# Patient Record
Sex: Female | Born: 1995 | Race: White | Hispanic: No | Marital: Single | Smoking: Never smoker
Health system: Southern US, Community
[De-identification: ages and names within clinical notes are randomized; demographics above are authoritative.]

## PROBLEM LIST (undated history)

## (undated) DIAGNOSIS — F32A Depression, unspecified: Secondary | ICD-10-CM

## (undated) DIAGNOSIS — F909 Attention-deficit hyperactivity disorder, unspecified type: Secondary | ICD-10-CM

## (undated) DIAGNOSIS — F329 Major depressive disorder, single episode, unspecified: Secondary | ICD-10-CM

## (undated) HISTORY — PX: URETER SURGERY: SHX823

---
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
## 2016-07-27 DIAGNOSIS — M25542 Pain in joints of left hand: Secondary | ICD-10-CM | POA: Diagnosis not present

## 2016-09-16 ENCOUNTER — Encounter: Payer: Self-pay | Admitting: Emergency Medicine

## 2016-09-16 ENCOUNTER — Emergency Department (HOSPITAL_COMMUNITY)
Admission: EM | Admit: 2016-09-16 | Discharge: 2016-09-16 | Disposition: A | Discharge status: Home / Self Care | Payer: BLUE CROSS/BLUE SHIELD | Attending: Emergency Medicine | Admitting: Emergency Medicine

## 2016-09-16 DIAGNOSIS — R112 Nausea with vomiting, unspecified: Secondary | ICD-10-CM | POA: Diagnosis not present

## 2016-09-16 DIAGNOSIS — F909 Attention-deficit hyperactivity disorder, unspecified type: Secondary | ICD-10-CM | POA: Insufficient documentation

## 2016-09-16 HISTORY — DX: Depression, unspecified: F32.A

## 2016-09-16 HISTORY — DX: Attention-deficit hyperactivity disorder, unspecified type: F90.9

## 2016-09-16 HISTORY — DX: Major depressive disorder, single episode, unspecified: F32.9

## 2016-09-16 LAB — URINALYSIS, ROUTINE W REFLEX MICROSCOPIC
Bilirubin Urine: NEGATIVE
Glucose, UA: NEGATIVE mg/dL
Ketones, ur: NEGATIVE mg/dL
Leukocytes, UA: NEGATIVE
Nitrite: NEGATIVE
Protein, ur: 30 mg/dL — AB
Specific Gravity, Urine: 1.017 (ref 1.005–1.030)
pH: 8 (ref 5.0–8.0)

## 2016-09-16 LAB — COMPREHENSIVE METABOLIC PANEL
ALT: 25 U/L (ref 14–54)
AST: 28 U/L (ref 15–41)
Albumin: 4.6 g/dL (ref 3.5–5.0)
Alkaline Phosphatase: 54 U/L (ref 38–126)
Anion gap: 9 (ref 5–15)
BUN: 13 mg/dL (ref 6–20)
CO2: 25 mmol/L (ref 22–32)
Calcium: 9.5 mg/dL (ref 8.9–10.3)
Chloride: 107 mmol/L (ref 101–111)
Creatinine, Ser: 0.73 mg/dL (ref 0.44–1.00)
GFR calc Af Amer: >60 mL/min (ref >60)
GFR calc non Af Amer: >60 mL/min (ref >60)
Glucose, Bld: 135 mg/dL — ABNORMAL HIGH (ref 65–99)
Potassium: 3.7 mmol/L (ref 3.5–5.1)
Sodium: 141 mmol/L (ref 135–145)
Total Bilirubin: 0.6 mg/dL (ref 0.3–1.2)
Total Protein: 8 g/dL (ref 6.5–8.1)

## 2016-09-16 LAB — CBC
HCT: 40.1 % (ref 36.0–46.0)
Hemoglobin: 14.3 g/dL (ref 12.0–15.0)
MCH: 31.6 pg (ref 26.0–34.0)
MCHC: 35.7 g/dL (ref 30.0–36.0)
MCV: 88.7 fL (ref 78.0–100.0)
Platelets: 318 10*3/uL (ref 150–400)
RBC: 4.52 MIL/uL (ref 3.87–5.11)
RDW: 12.5 % (ref 11.5–15.5)
WBC: 9 10*3/uL (ref 4.0–10.5)

## 2016-09-16 LAB — POC URINE PREG, ED: Preg Test, Ur: NEGATIVE

## 2016-09-16 LAB — LIPASE, BLOOD: Lipase: 22 U/L (ref 11–51)

## 2016-09-16 MED ORDER — ACETAMINOPHEN 325 MG PO TABS
650.0000 mg | ORAL_TABLET | Freq: Once | ORAL | Status: AC
  Administered 2016-09-16: 650 mg via ORAL
  Filled 2016-09-16: qty 2

## 2016-09-16 MED ORDER — ONDANSETRON HCL 4 MG PO TABS: 4.0000 mg | ORAL_TABLET | Freq: Three times a day (TID) | ORAL | 0 refills | Status: DC | PRN

## 2016-09-16 MED ORDER — DICYCLOMINE HCL 20 MG PO TABS: 20.0000 mg | ORAL_TABLET | Freq: Two times a day (BID) | ORAL | 0 refills | Status: DC

## 2016-09-16 MED ORDER — ONDANSETRON 8 MG PO TBDP
8.0000 mg | ORAL_TABLET | Freq: Once | ORAL | Status: AC
  Administered 2016-09-16: 8 mg via ORAL
  Filled 2016-09-16: qty 1

## 2016-09-16 MED ORDER — DICYCLOMINE HCL 10 MG PO CAPS
20.0000 mg | ORAL_CAPSULE | Freq: Once | ORAL | Status: AC
  Administered 2016-09-16: 20 mg via ORAL
  Filled 2016-09-16: qty 2

## 2016-09-16 NOTE — ED Provider Notes (Signed)
him like a light blue light.  [facility]-EMERGENCY DEPT Provider Note   CSN: [id number] Arrival date No & time: 09/16/16  [id number]     History   Chief Complaint Chief Complaint  Patient presents with  . Abdominal Pain  . Emesis  . Nausea    HPI Cassandra Rangel is a 21 y.o. female patient with onset abd pain, and about 15 episodes of vomiting NBNB vomitous. She has generalized abdominal pain and persistent nausea. She binge drank last night but states that it was not more than usual, however her vomiting was unusual. She denies contacts with similar sxs, ingestion of suspect foods or water, history of similar sxs, recent foreign travel . Denies hx of abdominal surgery, diarrhea or constipation.   HPI  Past Medical History:  Diagnosis Date  . ADHD   . Depression     There are no active problems to display for this patient.   History reviewed. No pertinent surgical history.  OB History    No data available       Home Medications    Prior to Admission medications   Not on File    Family History History reviewed. No pertinent family history.  Social History Social History  Substance Use Topics  . Smoking status: Never Smoker  . Smokeless tobacco: Never Used  . Alcohol use Yes     Allergies   Patient has no known allergies.   Review of Systems Review of Systems Ten systems reviewed and are negative for acute change, except as noted in the HPI.    Physical Exam Updated Vital Signs BP 132/86 (BP Location: Right Arm)   Pulse 76   Temp (!) 97.5 F (36.4 C) (Oral)   Resp 16   LMP 09/14/2016   SpO2 99%   Physical Exam  Constitutional: She is oriented to person, place, and time. She appears well-developed and well-nourished. No distress.  peekid Appears ill  HENT:  Head: Normocephalic and atraumatic.  Eyes: Pupils are equal, round, and reactive to light. Conjunctivae and EOM are normal. No scleral icterus.  Neck: Normal range of motion.    Cardiovascular: Normal rate, regular rhythm and normal heart sounds.  Exam reveals no gallop and no friction rub.   No murmur heard. Pulmonary/Chest: Effort normal and breath sounds normal. No respiratory distress.  Abdominal: Soft. Bowel sounds are normal. She exhibits no distension and no mass. There is no tenderness. There is no guarding.  Neurological: She is alert and oriented to person, place, and time.  Skin: Skin is warm. She is diaphoretic.  Psychiatric: Her behavior is normal.  Nursing note and vitals reviewed.    ED Treatments / Results  Labs (all labs ordered are listed, but only abnormal results are displayed) Labs Reviewed  COMPREHENSIVE METABOLIC PANEL - Abnormal; Notable for the following:       Result Value   Glucose, Bld 135 (*)    All other components within normal limits  URINALYSIS, ROUTINE W REFLEX MICROSCOPIC - Abnormal; Notable for the following:    APPearance HAZY (*)    Hgb urine dipstick SMALL (*)    Protein, ur 30 (*)    Bacteria, UA MANY (*)    Squamous Epithelial / LPF 0-5 (*)    All other components within normal limits  LIPASE, BLOOD  CBC  POC URINE PREG, ED    EKG  EKG Interpretation None       Radiology No results found.  Procedures Procedures (including critical care  time)  Medications Ordered in ED Medications  ondansetron (ZOFRAN-ODT) disintegrating tablet 8 mg (8 mg Oral Given 09/16/16 1109)     Initial Impression / Assessment and Plan / ED Course  I have reviewed the triage vital signs and the nursing notes.  Pertinent labs & imaging results that were available during my care of the patient were reviewed by me and considered in my medical decision making (see chart for details).     Patient with symptoms consistent with viral gastroenteritis.  Vitals are stable, no fever.  No signs of dehydration, tolerating PO fluids > 6 oz.  Lungs are clear.  No focal abdominal pain, no concern for appendicitis, cholecystitis,  pancreatitis, ruptured viscus, UTI, kidney stone, or any other abdominal etiology.  Supportive therapy indicated with return if symptoms worsen.  Patient counseled.  Final Clinical Impressions(s) / ED Diagnoses   Final diagnoses:  None    New Prescriptions New Prescriptions   No medications on file     Arthor CaptainHarris, Lonia Roane, PA-C 09/16/16 1640    Raeford RazorKohut, Stephen, MD 09/17/16 [phone]

## 2016-09-16 NOTE — ED Notes (Signed)
Provider at bedside

## 2016-09-16 NOTE — Discharge Instructions (Signed)

## 2016-09-16 NOTE — ED Triage Notes (Signed)
Pt reports 6/10 abd pain and emesis that started this AM.

## 2016-09-16 NOTE — ED Notes (Signed)
Patient able to keep fluids down.  She is c/o some throat pain but she thinks this might be due to vomiting earlier.

## 2016-12-05 DIAGNOSIS — Z6825 Body mass index (BMI) 25.0-25.9, adult: Secondary | ICD-10-CM | POA: Diagnosis not present

## 2016-12-05 DIAGNOSIS — N898 Other specified noninflammatory disorders of vagina: Secondary | ICD-10-CM | POA: Diagnosis not present

## 2016-12-05 DIAGNOSIS — Z113 Encounter for screening for infections with a predominantly sexual mode of transmission: Secondary | ICD-10-CM | POA: Diagnosis not present

## 2016-12-05 DIAGNOSIS — Z01419 Encounter for gynecological examination (general) (routine) without abnormal findings: Secondary | ICD-10-CM | POA: Diagnosis not present

## 2017-04-08 ENCOUNTER — Emergency Department (HOSPITAL_COMMUNITY)
Admission: EM | Admit: 2017-04-08 | Discharge: 2017-04-08 | Disposition: A | Discharge status: Home / Self Care | Payer: BLUE CROSS/BLUE SHIELD | Attending: Emergency Medicine | Admitting: Emergency Medicine

## 2017-04-08 ENCOUNTER — Other Ambulatory Visit: Payer: Self-pay

## 2017-04-08 ENCOUNTER — Encounter (HOSPITAL_COMMUNITY): Payer: Self-pay

## 2017-04-08 DIAGNOSIS — Z5321 Procedure and treatment not carried out due to patient leaving prior to being seen by health care provider: Secondary | ICD-10-CM | POA: Insufficient documentation

## 2017-04-08 DIAGNOSIS — H16041 Marginal corneal ulcer, right eye: Secondary | ICD-10-CM | POA: Diagnosis not present

## 2017-04-08 DIAGNOSIS — H5711 Ocular pain, right eye: Secondary | ICD-10-CM | POA: Diagnosis not present

## 2017-04-08 NOTE — ED Notes (Signed)
2x call to tx area, no answer

## 2017-04-08 NOTE — ED Notes (Signed)
Bed: WTR7 Expected date:  Expected time:  Means of arrival:  Comments: 

## 2017-04-08 NOTE — ED Triage Notes (Signed)
She c/o pain in right eye, which persisted even after she removed the contact lens from that eye.

## 2017-04-08 NOTE — ED Notes (Signed)
Pt did not answer when called

## 2017-04-08 NOTE — ED Notes (Signed)
1x call to fast track, no answer

## 2017-04-09 DIAGNOSIS — H16041 Marginal corneal ulcer, right eye: Secondary | ICD-10-CM | POA: Diagnosis not present

## 2017-05-25 DIAGNOSIS — F3289 Other specified depressive episodes: Secondary | ICD-10-CM | POA: Diagnosis not present

## 2017-05-25 DIAGNOSIS — M79671 Pain in right foot: Secondary | ICD-10-CM | POA: Diagnosis not present

## 2017-06-28 DIAGNOSIS — M79671 Pain in right foot: Secondary | ICD-10-CM | POA: Diagnosis not present

## 2017-07-12 DIAGNOSIS — M79671 Pain in right foot: Secondary | ICD-10-CM | POA: Diagnosis not present

## 2017-07-13 DIAGNOSIS — M79671 Pain in right foot: Secondary | ICD-10-CM | POA: Diagnosis not present

## 2017-07-18 DIAGNOSIS — M79671 Pain in right foot: Secondary | ICD-10-CM | POA: Diagnosis not present

## 2017-07-19 DIAGNOSIS — L700 Acne vulgaris: Secondary | ICD-10-CM | POA: Diagnosis not present

## 2017-08-09 DIAGNOSIS — M79671 Pain in right foot: Secondary | ICD-10-CM | POA: Diagnosis not present

## 2017-08-22 DIAGNOSIS — Z79899 Other long term (current) drug therapy: Secondary | ICD-10-CM | POA: Diagnosis not present

## 2017-08-22 DIAGNOSIS — L700 Acne vulgaris: Secondary | ICD-10-CM | POA: Diagnosis not present

## 2017-08-31 DIAGNOSIS — M79671 Pain in right foot: Secondary | ICD-10-CM | POA: Diagnosis not present

## 2017-10-15 DIAGNOSIS — M79671 Pain in right foot: Secondary | ICD-10-CM | POA: Diagnosis not present

## 2017-10-18 DIAGNOSIS — L700 Acne vulgaris: Secondary | ICD-10-CM | POA: Diagnosis not present

## 2017-10-23 DIAGNOSIS — M79671 Pain in right foot: Secondary | ICD-10-CM | POA: Diagnosis not present

## 2017-10-31 DIAGNOSIS — M79671 Pain in right foot: Secondary | ICD-10-CM | POA: Diagnosis not present

## 2018-02-04 ENCOUNTER — Other Ambulatory Visit: Payer: Self-pay

## 2018-02-04 ENCOUNTER — Emergency Department (HOSPITAL_COMMUNITY): Payer: BLUE CROSS/BLUE SHIELD

## 2018-02-04 ENCOUNTER — Encounter (HOSPITAL_COMMUNITY): Payer: Self-pay | Admitting: Emergency Medicine

## 2018-02-04 ENCOUNTER — Observation Stay (HOSPITAL_COMMUNITY)
Admission: EM | Admit: 2018-02-04 | Discharge: 2018-02-06 | Disposition: A | Discharge status: Home / Self Care | Payer: BLUE CROSS/BLUE SHIELD | Attending: Internal Medicine | Admitting: Internal Medicine

## 2018-02-04 DIAGNOSIS — F329 Major depressive disorder, single episode, unspecified: Secondary | ICD-10-CM | POA: Diagnosis not present

## 2018-02-04 DIAGNOSIS — F909 Attention-deficit hyperactivity disorder, unspecified type: Secondary | ICD-10-CM | POA: Diagnosis not present

## 2018-02-04 DIAGNOSIS — R112 Nausea with vomiting, unspecified: Secondary | ICD-10-CM | POA: Diagnosis not present

## 2018-02-04 DIAGNOSIS — E538 Deficiency of other specified B group vitamins: Secondary | ICD-10-CM | POA: Diagnosis present

## 2018-02-04 DIAGNOSIS — R000 Tachycardia, unspecified: Secondary | ICD-10-CM | POA: Diagnosis not present

## 2018-02-04 DIAGNOSIS — E876 Hypokalemia: Secondary | ICD-10-CM | POA: Diagnosis not present

## 2018-02-04 DIAGNOSIS — N10 Acute tubulo-interstitial nephritis: Secondary | ICD-10-CM | POA: Diagnosis not present

## 2018-02-04 DIAGNOSIS — R1110 Vomiting, unspecified: Secondary | ICD-10-CM | POA: Diagnosis not present

## 2018-02-04 DIAGNOSIS — E860 Dehydration: Secondary | ICD-10-CM | POA: Diagnosis present

## 2018-02-04 DIAGNOSIS — E349 Endocrine disorder, unspecified: Secondary | ICD-10-CM | POA: Diagnosis present

## 2018-02-04 DIAGNOSIS — K219 Gastro-esophageal reflux disease without esophagitis: Secondary | ICD-10-CM | POA: Insufficient documentation

## 2018-02-04 DIAGNOSIS — A419 Sepsis, unspecified organism: Secondary | ICD-10-CM | POA: Diagnosis present

## 2018-02-04 DIAGNOSIS — N179 Acute kidney failure, unspecified: Secondary | ICD-10-CM | POA: Diagnosis present

## 2018-02-04 DIAGNOSIS — N12 Tubulo-interstitial nephritis, not specified as acute or chronic: Secondary | ICD-10-CM | POA: Diagnosis not present

## 2018-02-04 DIAGNOSIS — R5383 Other fatigue: Secondary | ICD-10-CM | POA: Diagnosis not present

## 2018-02-04 DIAGNOSIS — R7989 Other specified abnormal findings of blood chemistry: Secondary | ICD-10-CM | POA: Diagnosis present

## 2018-02-04 DIAGNOSIS — I959 Hypotension, unspecified: Secondary | ICD-10-CM | POA: Diagnosis present

## 2018-02-04 LAB — COMPREHENSIVE METABOLIC PANEL
ALBUMIN: 4.1 g/dL (ref 3.5–5.0)
ALT: 14 U/L (ref 0–44)
ANION GAP: 13 (ref 5–15)
AST: 20 U/L (ref 15–41)
Alkaline Phosphatase: 65 U/L (ref 38–126)
BUN: 16 mg/dL (ref 6–20)
CO2: 24 mmol/L (ref 22–32)
Calcium: 9.3 mg/dL (ref 8.9–10.3)
Chloride: 99 mmol/L (ref 98–111)
Creatinine, Ser: 1.04 mg/dL — ABNORMAL HIGH (ref 0.44–1.00)
GFR calc Af Amer: >60 mL/min (ref >60)
GFR calc non Af Amer: >60 mL/min (ref >60)
GLUCOSE: 130 mg/dL — AB (ref 70–99)
Potassium: 3.4 mmol/L — ABNORMAL LOW (ref 3.5–5.1)
Sodium: 136 mmol/L (ref 135–145)
Total Bilirubin: 0.8 mg/dL (ref 0.3–1.2)
Total Protein: 8.3 g/dL — ABNORMAL HIGH (ref 6.5–8.1)

## 2018-02-04 LAB — CBC WITH DIFFERENTIAL/PLATELET
Abs Immature Granulocytes: 0.5 10*3/uL — ABNORMAL HIGH (ref 0.00–0.07)
Basophils Absolute: 0.1 10*3/uL (ref 0.0–0.1)
Basophils Relative: 0 %
Eosinophils Absolute: 0.1 10*3/uL (ref 0.0–0.5)
Eosinophils Relative: 0 %
HCT: 38.7 % (ref 36.0–46.0)
Hemoglobin: 13.1 g/dL (ref 12.0–15.0)
Immature Granulocytes: 2 %
Lymphocytes Relative: 5 %
Lymphs Abs: 1.3 10*3/uL (ref 0.7–4.0)
MCH: 32.3 pg (ref 26.0–34.0)
MCHC: 33.9 g/dL (ref 30.0–36.0)
MCV: 95.6 fL (ref 80.0–100.0)
Monocytes Absolute: 2.5 10*3/uL — ABNORMAL HIGH (ref 0.1–1.0)
Monocytes Relative: 9 %
Neutro Abs: 23.6 10*3/uL — ABNORMAL HIGH (ref 1.7–7.7)
Neutrophils Relative %: 84 %
Platelets: 206 10*3/uL (ref 150–400)
RBC: 4.05 MIL/uL (ref 3.87–5.11)
RDW: 12.4 % (ref 11.5–15.5)
WBC: 28 10*3/uL — ABNORMAL HIGH (ref 4.0–10.5)
nRBC: 0 % (ref 0.0–0.2)

## 2018-02-04 LAB — URINALYSIS, ROUTINE W REFLEX MICROSCOPIC
Bilirubin Urine: NEGATIVE
Glucose, UA: NEGATIVE mg/dL
Ketones, ur: 80 mg/dL — AB
Nitrite: NEGATIVE
Protein, ur: 100 mg/dL — AB
RBC / HPF: >50 RBC/hpf — ABNORMAL HIGH (ref 0–5)
Specific Gravity, Urine: 1.018 (ref 1.005–1.030)
pH: 6 (ref 5.0–8.0)

## 2018-02-04 LAB — I-STAT BETA HCG BLOOD, ED (MC, WL, AP ONLY): I-stat hCG, quantitative: 19.1 m[IU]/mL — ABNORMAL HIGH (ref <5)

## 2018-02-04 LAB — HCG, QUANTITATIVE, PREGNANCY: HCG, BETA CHAIN, QUANT, S: 1 m[IU]/mL (ref <5)

## 2018-02-04 LAB — I-STAT CG4 LACTIC ACID, ED: Lactic Acid, Venous: 1.12 mmol/L (ref 0.5–1.9)

## 2018-02-04 MED ORDER — ONDANSETRON HCL 4 MG/2ML IJ SOLN
4.0000 mg | Freq: Once | INTRAMUSCULAR | Status: AC
  Administered 2018-02-04: 4 mg via INTRAVENOUS
  Filled 2018-02-04: qty 2

## 2018-02-04 MED ORDER — SODIUM CHLORIDE 0.9 % IV SOLN
INTRAVENOUS | Status: DC
  Administered 2018-02-05 (×3): via INTRAVENOUS

## 2018-02-04 MED ORDER — KETOROLAC TROMETHAMINE 30 MG/ML IJ SOLN
30.0000 mg | Freq: Four times a day (QID) | INTRAMUSCULAR | Status: DC | PRN
  Administered 2018-02-05 (×2): 30 mg via INTRAVENOUS
  Filled 2018-02-04 (×2): qty 1

## 2018-02-04 MED ORDER — SODIUM CHLORIDE 0.9 % IV BOLUS
1000.0000 mL | Freq: Once | INTRAVENOUS | Status: AC
  Administered 2018-02-04: 1000 mL via INTRAVENOUS

## 2018-02-04 MED ORDER — IOPAMIDOL (ISOVUE-300) INJECTION 61%
INTRAVENOUS | Status: AC
  Filled 2018-02-04: qty 100

## 2018-02-04 MED ORDER — METHOCARBAMOL 500 MG PO TABS: 500.0000 mg | ORAL_TABLET | Freq: Two times a day (BID) | ORAL | 0 refills | Status: DC

## 2018-02-04 MED ORDER — METOCLOPRAMIDE HCL 5 MG/ML IJ SOLN
5.0000 mg | Freq: Once | INTRAMUSCULAR | Status: AC
  Administered 2018-02-04: 5 mg via INTRAVENOUS
  Filled 2018-02-04: qty 2

## 2018-02-04 MED ORDER — ENOXAPARIN SODIUM 40 MG/0.4ML ~~LOC~~ SOLN
40.0000 mg | Freq: Every day | SUBCUTANEOUS | Status: DC
  Administered 2018-02-05 (×2): 40 mg via SUBCUTANEOUS
  Filled 2018-02-04 (×2): qty 0.4

## 2018-02-04 MED ORDER — SULFAMETHOXAZOLE-TRIMETHOPRIM 800-160 MG PO TABS: 1.0000 | ORAL_TABLET | Freq: Two times a day (BID) | ORAL | 0 refills | Status: DC

## 2018-02-04 MED ORDER — HYDROMORPHONE HCL 1 MG/ML IJ SOLN: 0.5000 mg | Freq: Once | INTRAMUSCULAR | Status: DC

## 2018-02-04 MED ORDER — ONDANSETRON HCL 4 MG PO TABS: 4.0000 mg | ORAL_TABLET | Freq: Four times a day (QID) | ORAL | Status: DC | PRN

## 2018-02-04 MED ORDER — SODIUM CHLORIDE (PF) 0.9 % IJ SOLN
INTRAMUSCULAR | Status: AC
  Filled 2018-02-04: qty 50

## 2018-02-04 MED ORDER — DIPHENHYDRAMINE HCL 50 MG/ML IJ SOLN
12.5000 mg | Freq: Once | INTRAMUSCULAR | Status: AC
  Administered 2018-02-04: 12.5 mg via INTRAVENOUS
  Filled 2018-02-04: qty 1

## 2018-02-04 MED ORDER — SODIUM CHLORIDE 0.9 % IV SOLN
1.0000 g | INTRAVENOUS | Status: DC
  Administered 2018-02-05 – 2018-02-06 (×2): 1 g via INTRAVENOUS
  Filled 2018-02-04 (×2): qty 1

## 2018-02-04 MED ORDER — IOPAMIDOL (ISOVUE-300) INJECTION 61%
100.0000 mL | Freq: Once | INTRAVENOUS | Status: AC | PRN
  Administered 2018-02-04: 100 mL via INTRAVENOUS

## 2018-02-04 MED ORDER — ONDANSETRON 4 MG PO TBDP: 4.0000 mg | ORAL_TABLET | Freq: Three times a day (TID) | ORAL | 0 refills | Status: DC | PRN

## 2018-02-04 MED ORDER — ONDANSETRON HCL 4 MG/2ML IJ SOLN
4.0000 mg | Freq: Four times a day (QID) | INTRAMUSCULAR | Status: DC | PRN
  Administered 2018-02-05: 4 mg via INTRAVENOUS
  Filled 2018-02-04: qty 2

## 2018-02-04 MED ORDER — ACETAMINOPHEN 325 MG PO TABS
650.0000 mg | ORAL_TABLET | Freq: Once | ORAL | Status: AC
  Administered 2018-02-04: 650 mg via ORAL
  Filled 2018-02-04: qty 2

## 2018-02-04 MED ORDER — POTASSIUM CHLORIDE 10 MEQ/100ML IV SOLN
10.0000 meq | INTRAVENOUS | Status: AC
  Filled 2018-02-04: qty 100

## 2018-02-04 MED ORDER — SODIUM CHLORIDE 0.9 % IV SOLN
1.0000 g | Freq: Once | INTRAVENOUS | Status: AC
  Administered 2018-02-04: 1 g via INTRAVENOUS
  Filled 2018-02-04: qty 10

## 2018-02-04 NOTE — ED Provider Notes (Addendum)
[facility]-EMERGENCY DEPT Provider Note   CSN: [id number] Arrival date & time: 02/04/18  1422     History   Chief Complaint Chief Complaint  Patient presents with  . elevated WBC  . Flank Pain    HPI Cassandra Rangel is a 22 y.o. female who presents to ED for 3-day history of right-sided flank pain radiating to right middle quadrant and right lower quadrant.  States that she had a cheerleading competition 3 days ago which exacerbated her pain.  She also complains of a headache, several episodes of nonbloody, nonbilious emesis since 3 days ago as well.  She has had improvement in her headache with Excedrin.  She took Excedrin approximately 5 hours prior to arrival.  She denies any direct sick contacts with similar symptoms but states that she may have been exposed to something while being around children at the cheerleading competition.  She denies any dysuria, hematuria, history of kidney stones, vaginal complaints, fever. 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  HPI  Past Medical History:  Diagnosis Date  . ADHD   . Depression     There are no active problems to display for this patient.   Past Surgical History:  Procedure Laterality Date  . URETER SURGERY       OB History   No obstetric history on file.      Home Medications    Prior to Admission medications   Medication Sig Start Date End Date Taking? Authorizing Provider  Adapalene 0.3 % gel Apply 1 application topically daily.   Yes [provider]  aspirin-acetaminophen-caffeine (EXCEDRIN MIGRAINE) [phone] MG tablet Take 2 tablets by mouth daily as needed for headache or migraine.   Yes [provider]  dicyclomine (BENTYL) 20 MG tablet Take 1 tablet (20 mg total) by mouth 2 (two) times daily. Patient not taking: Reported on 02/04/2018 09/16/16   Arthor Captain, PA-C  methocarbamol (ROBAXIN) 500 MG tablet Take  1 tablet (500 mg total) by mouth 2 (two) times daily. 02/04/18   Cassandra Kohen, PA-C  ondansetron (ZOFRAN ODT) 4 MG disintegrating tablet Take 1 tablet (4 mg total) by mouth every 8 (eight) hours as needed for nausea or vomiting. 02/04/18   Cassandra Wachsmuth, PA-C  ondansetron (ZOFRAN) 4 MG tablet Take 1 tablet (4 mg total) by mouth every 8 (eight) hours as needed for nausea or vomiting. Patient not taking: Reported on 02/04/2018 09/16/16   Arthor Captain, PA-C  sulfamethoxazole-trimethoprim (BACTRIM DS,SEPTRA DS) 800-160 MG tablet Take 1 tablet by mouth 2 (two) times daily for 10 days. 02/04/18 02/14/18  Dietrich Pates, PA-C    Family History No family history on file.  Social History Social History   Tobacco Use  . Smoking status: Never Smoker  . Smokeless tobacco: Never Used  Substance Use Topics  . Alcohol use: Yes  . Drug use: Not on file     Allergies   Patient has no known allergies.   Review of Systems Review of Systems  Constitutional: Positive for diaphoresis and fatigue. Negative for appetite change, chills and fever.  HENT: Negative for ear pain, rhinorrhea, sneezing and sore throat.   Eyes: Negative for photophobia and visual disturbance.  Respiratory: Negative for cough, chest tightness, shortness of breath and wheezing.   Cardiovascular: Negative for chest pain and palpitations.  Gastrointestinal: Positive for nausea and vomiting. Negative for abdominal pain, blood in stool, constipation and diarrhea.  Genitourinary:  Positive for flank pain. Negative for dysuria, hematuria and urgency.  Musculoskeletal: Negative for myalgias.  Skin: Negative for rash.  Neurological: Negative for dizziness, weakness and light-headedness.     Physical Exam Updated Vital Signs BP (!) 97/52   Pulse 75   Temp 99.1 F (37.3 C) (Oral)   Resp 16   Ht 5' 5.5" (1.664 m)   Wt 66.3 kg   LMP 01/06/2018   SpO2 100%   BMI 23.94 kg/m   Physical Exam Vitals signs and nursing note  reviewed.  Constitutional:      General: She is not in acute distress.    Appearance: She is well-developed.     Comments: Diaphoretic. NAD.  HENT:     Head: Normocephalic and atraumatic.     Nose: Nose normal.  Eyes:     General: No scleral icterus.       Right eye: No discharge.        Left eye: No discharge.     Conjunctiva/sclera: Conjunctivae normal.  Neck:     Musculoskeletal: Normal range of motion and neck supple.     Comments: No meningismus. Cardiovascular:     Rate and Rhythm: Normal rate and regular rhythm.     Heart sounds: Normal heart sounds. No murmur. No friction rub. No gallop.   Pulmonary:     Effort: Pulmonary effort is normal. No respiratory distress.     Breath sounds: Normal breath sounds.  Abdominal:     General: Bowel sounds are normal. There is no distension.     Palpations: Abdomen is soft.     Tenderness: There is abdominal tenderness (RMQ, RLQ, R flank). There is no guarding.  Musculoskeletal: Normal range of motion.  Skin:    General: Skin is warm and dry.     Findings: No rash.  Neurological:     Mental Status: She is alert and oriented to person, place, and time.     Cranial Nerves: No cranial nerve deficit.     Sensory: No sensory deficit.     Motor: No abnormal muscle tone.     Coordination: Coordination normal.      ED Treatments / Results  Labs (all labs ordered are listed, but only abnormal results are displayed) Labs Reviewed  COMPREHENSIVE METABOLIC PANEL - Abnormal; Notable for the following components:      Result Value   Potassium 3.4 (*)    Glucose, Bld 130 (*)    Creatinine, Ser 1.04 (*)    Total Protein 8.3 (*)    All other components within normal limits  CBC WITH DIFFERENTIAL/PLATELET - Abnormal; Notable for the following components:   WBC 28.0 (*)    Neutro Abs 23.6 (*)    Monocytes Absolute 2.5 (*)    Abs Immature Granulocytes 0.50 (*)    All other components within normal limits  URINALYSIS, ROUTINE W REFLEX  MICROSCOPIC - Abnormal; Notable for the following components:   APPearance HAZY (*)    Hgb urine dipstick LARGE (*)    Ketones, ur 80 (*)    Protein, ur 100 (*)    Leukocytes, UA TRACE (*)    RBC / HPF >50 (*)    Bacteria, UA RARE (*)    All other components within normal limits  I-STAT BETA HCG BLOOD, ED ([facility], [facility], [facility] ONLY) - Abnormal; Notable for the following components:   I-stat hCG, quantitative 19.1 (*)    All other components within normal limits  URINE CULTURE  HCG, QUANTITATIVE,  PREGNANCY  I-STAT CG4 LACTIC ACID, ED    EKG None  Radiology Ct Abdomen Pelvis W Contrast  Result Date: 02/04/2018 CLINICAL DATA:  Leukocytosis and flank pain with nausea and vomiting since Saturday. EXAM: CT ABDOMEN AND PELVIS WITH CONTRAST TECHNIQUE: Multidetector CT imaging of the abdomen and pelvis was performed using the standard protocol following bolus administration of intravenous contrast. CONTRAST:  100mL ISOVUE-300 IOPAMIDOL (ISOVUE-300) INJECTION 61% COMPARISON:  12/01/2008 FINDINGS: Lower chest: No acute abnormality. Hepatobiliary: No focal liver abnormality is seen. No gallstones, gallbladder wall thickening, or biliary dilatation. Pancreas: Unremarkable. No pancreatic ductal dilatation or surrounding inflammatory changes. Spleen: Normal in size without focal abnormality. Adrenals/Urinary Tract: Normal bilateral adrenal glands. Abnormal enhancement pattern of the right kidney with striated appearance consistent with acute pyelonephritis. More confluent areas of hypodensity are identified in the upper pole of the right kidney as well as it's medial interpolar aspect that may reflect areas of lobar nephronia. No obstructive uropathy. The urinary bladder is unremarkable without focal mural thickening. Stomach/Bowel: Stomach is within normal limits. Appendix appears normal. No evidence of bowel wall thickening, distention, or inflammatory changes. Vascular/Lymphatic: No significant vascular findings  are present. No enlarged abdominal or pelvic lymph nodes. Reproductive: Ut the uterus is unremarkable. Small peripheral follicles are noted within both ovaries. These may reflect stigmata of polycystic ovarian syndrome Other: No free air free fluid. Musculoskeletal: No acute or significant osseous findings. IMPRESSION: 1. Abnormal enhancement pattern of the right kidney with striated appearance of the right kidney consistent with acute pyelonephritis. More confluent areas of hypodensity are identified in the upper pole and medial interpolar aspect of the right kidney that may reflect areas of lobar nephronia. No obstructive uropathy. 2. Small peripheral follicles are noted within both ovaries. These may reflect stigmata of polycystic ovarian syndrome. Electronically Signed   By: Tollie Ethavid  Kwon M.D.   On: 02/04/2018 18:28    Procedures Procedures (including critical care time)  Medications Ordered in ED Medications  iopamidol (ISOVUE-300) 61 % injection (has no administration in time range)  sodium chloride (PF) 0.9 % injection (has no administration in time range)  sodium chloride 0.9 % bolus 1,000 mL (1,000 mLs Intravenous New Bag/Given 02/04/18 1925)  sodium chloride 0.9 % bolus 1,000 mL (0 mLs Intravenous Stopped 02/04/18 1745)  metoCLOPramide (REGLAN) injection 5 mg (5 mg Intravenous Given 02/04/18 1742)  diphenhydrAMINE (BENADRYL) injection 12.5 mg (12.5 mg Intravenous Given 02/04/18 1742)  iopamidol (ISOVUE-300) 61 % injection 100 mL (100 mLs Intravenous Contrast Given 02/04/18 1806)  cefTRIAXone (ROCEPHIN) 1 g in sodium chloride 0.9 % 100 mL IVPB (1 g Intravenous New Bag/Given 02/04/18 1926)     Initial Impression / Assessment and Plan / ED Course  I have reviewed the triage vital signs and the nursing notes.  Pertinent labs & imaging results that were available during my care of the patient were reviewed by me and considered in my medical decision making (see chart for  details).  Clinical Course as of Feb 04 2021  Mon Feb 04, 2018  1859 Spoke to Dr. Berneice HeinrichManny of urology.  He recommends trial of outpatient p.o. antibiotics for infection.  He was able to view images.   [HK]    Clinical Course User Index [HK] Dietrich PatesKhatri, Harbert Fitterer, PA-C    22 year old female presents to ED for 3-day history of right-sided flank pain radiating to right middle and lower quadrant of the abdomen.  She also reports headache, nonbloody, nonbilious emesis and generalized fatigue.  She had a cheerleading  competition 3 days ago which she feels exacerbated her pain.  She has had improvement in her headache with Excedrin.  Denies any dysuria, hematuria, fever, vaginal complaints or changes to bowel movements.  On exam patient is in no acute distress.  She is diaphoretic.  Slightly tachycardic.  She is afebrile.  She has right CVA tenderness, right lower quadrant tenderness to palpation.  Lab work significant for leukocytosis of 18, i-STAT hCG slightly elevated at 19.  Recheck of hCG quant is negative.  CMP unremarkable.  Urine shows hematuria, trace leukocytes and rare bacteria.  CT of the abdomen pelvis shows findings consistent with pyelonephritis and possibly lobar nephronia. I spoke to Dr. Berneice Heinrich about this. He recommends PO Bactrim if patient is able to tolerate p.o. intake here.  She was given 2 L of fluids, Reglan, 1 g of Rocephin with significant improvement in her symptoms.  She is able to tolerate p.o. intake without difficulty.  She is comfortable with discharge home with antibiotics, antiemetics and muscle relaxer for her back pain. She was advised to return to ED for any severe or worsening symptoms.  9:15 PM At discharge, patient continues to have nausea, vomiting and chills.  Due to patient's inability to tolerate p.o. intake, I feel that she will benefit from admission and observation. Will consult hospitalist for admission.   Portions of this note were generated with [employer]. Dictation errors may occur despite best attempts at proofreading.   Final Clinical Impressions(s) / ED Diagnoses   Final diagnoses:  Pyelonephritis    ED Discharge Orders         Ordered    sulfamethoxazole-trimethoprim (BACTRIM DS,SEPTRA DS) 800-160 MG tablet  2 times daily     02/04/18 2017    ondansetron (ZOFRAN ODT) 4 MG disintegrating tablet  Every 8 hours PRN     02/04/18 2017    methocarbamol (ROBAXIN) 500 MG tablet  2 times daily     02/04/18 2017            Dietrich Pates, PA-C 02/04/18 2116    Benjiman Core, MD 02/05/18 0006

## 2018-02-04 NOTE — ED Triage Notes (Signed)
[facility] and flank pain and n/v since Saturday. Pt also has headache.

## 2018-02-04 NOTE — ED Notes (Signed)
Pt had vagal episode during blood draw. Unable to collect I-stat at this time.

## 2018-02-04 NOTE — H&P (Addendum)
History and Physical    Cassandra Rangel:[id number] DOB: 02/19/1996 DOA: 02/04/2018  Referring MD/NP/PA: Dietrich Pates, PA-C PCP: Patient, No Pcp Per  Patient coming from: home  Chief Complaint: Flank pain  I have personally briefly reviewed patient's old medical records in [facility]   HPI: Cassandra Rangel is a 22 y.o. female with medical history significant of ureter surgery; who presents with complaints of right flank pain.  Symptoms have been most notable over the last 2 to 3 days.  Reports having a feeling of a knot in her right flank and right-sided abdominal pain.  Pain was worse with deep inspiration.  Note associated symptoms of subjective fevers, chills, diaphoresis, muscle cramps, nausea, vomiting, and malaise.  Denies any prolonged travel, leg swelling, calf pain, chest pain, dysuria, urinary frequency, or loss of consciousness.  She reports never having symptoms like this previously in the past.  However, noted that the nausea and vomiting symptoms seem to occur around 8 PM every night since onset.  Emesis has been nonbloody.  Patient tried drinking Gatorade without significant improvement of symptoms.  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  Since that time she is had no problem denies any previous history of UTI.   ED Course: Admission into the emergency department patient was noted to have a temperature of 99.1 F, pulse 67-108, pressure is 90/51-118/76, all other vital signs maintained.  Labs revealed WBC and 28, potassium 3.4, BUN 16, creatinine 1.04, and lactic acid 1.12.  CT scan of the abdomen/pelvis reveal signs of acute right sided pyelonephritis with areas of lobar nephronia, but no signs of obstruction.  Patient was given 2 L normal saline IV fluids, Rocephin, Zofran, Reglan,  Tylenol, and Benadryl.  Urology Dr. Berneice Heinrich had been contacted regarding patient blood recommended oral Bactrim for 10 days when able.  Patient still was not able to tolerate p.o. intake. [facility] called to admit.   Review of Systems  Constitutional: Positive for chills, fever and malaise/fatigue.  HENT: Negative for congestion and ear pain.   Eyes: Negative for double vision and photophobia.  Respiratory: Negative for cough and shortness of breath.   Cardiovascular: Negative for chest pain, claudication and leg swelling.  Gastrointestinal: Positive for abdominal pain, nausea and vomiting. Negative for diarrhea.  Genitourinary: Positive for flank pain. Negative for dysuria and frequency.  Musculoskeletal: Positive for joint pain and myalgias. Negative for falls.  Skin: Negative for itching and rash.  Neurological: Negative for focal weakness and loss of consciousness.  Psychiatric/Behavioral: Negative for memory loss and substance abuse.    Past Medical History:  Diagnosis Date  . ADHD   . Depression     Past Surgical History:  Procedure Laterality Date  . URETER SURGERY       reports that she has never smoked. She has never used smokeless tobacco. She reports current alcohol use. No history on file for drug.  No Known Allergies  No family history on file.  Prior to Admission medications   Medication Sig Start Date End Date Taking? Authorizing Provider  Adapalene 0.3 % gel Apply 1 application topically daily.   Yes [provider]  aspirin-acetaminophen-caffeine (EXCEDRIN MIGRAINE) [phone] MG tablet Take 2 tablets by mouth daily as needed for headache or migraine.   Yes [provider]  dicyclomine (  BENTYL) 20 MG tablet Take 1 tablet (20 mg total) by mouth 2 (two) times daily. Patient not taking: Reported on 02/04/2018 09/16/16   Arthor CaptainHarris, Abigail, PA-C  methocarbamol (ROBAXIN) 500 MG tablet Take 1 tablet (500 mg total) by mouth 2 (two) times daily. 02/04/18    Khatri, Hina, PA-C  ondansetron (ZOFRAN ODT) 4 MG disintegrating tablet Take 1 tablet (4 mg total) by mouth every 8 (eight) hours as needed for nausea or vomiting. 02/04/18   Khatri, Hina, PA-C  ondansetron (ZOFRAN) 4 MG tablet Take 1 tablet (4 mg total) by mouth every 8 (eight) hours as needed for nausea or vomiting. Patient not taking: Reported on 02/04/2018 09/16/16   Arthor CaptainHarris, Abigail, PA-C  sulfamethoxazole-trimethoprim (BACTRIM DS,SEPTRA DS) 800-160 MG tablet Take 1 tablet by mouth 2 (two) times daily for 10 days. 02/04/18 02/14/18  Dietrich PatesKhatri, Hina, PA-C    Physical Exam:  Constitutional: NAD, calm, comfortable Vitals:   02/04/18 1800 02/04/18 1830 02/04/18 2000 02/04/18 2130  BP: 102/67 (!) 90/51 (!) 97/52 116/78  Pulse: 67 81 75 (!) 104  Resp: 16 14 16 17   Temp:      TempSrc:      SpO2: 98% 99% 100% 100%  Weight:      Height:       Eyes: PERRL, lids and conjunctivae normal ENMT: Mucous membranes are moist. Posterior pharynx clear of any exudate or lesions.Normal dentition.  Neck: normal, supple, no masses, no thyromegaly Respiratory: clear to auscultation bilaterally, no wheezing, no crackles. Normal respiratory effort. No accessory muscle use.  Cardiovascular: Regular rate and rhythm, no murmurs / rubs / gallops. No extremity edema. 2+ pedal pulses. No carotid bruits.  Abdomen: Mild right quadrant tenderness with moderate right CVA tenderness noted.  Bowel sounds within normal limits. Musculoskeletal: no clubbing / cyanosis. No joint deformity upper and lower extremities. Good ROM, no contractures. Normal muscle tone.  Skin: no rashes, lesions, ulcers. No induration Neurologic: CN 2-12 grossly intact. Sensation intact, DTR normal. Strength 5/5 in all 4.  Psychiatric: Normal judgment and insight. Alert and oriented x 3. Normal mood.     Labs on Admission: I have personally reviewed following labs and imaging studies  CBC: Recent Labs  Lab 02/04/18 1432  WBC 28.0*  NEUTROABS  23.6*  HGB 13.1  HCT 38.7  MCV 95.6  PLT 206   Basic Metabolic Panel: Recent Labs  Lab 02/04/18 1432  NA 136  K 3.4*  CL 99  CO2 24  GLUCOSE 130*  BUN 16  CREATININE 1.04*  CALCIUM 9.3   GFR: Estimated Creatinine Clearance: 78 mL/min (A) (by C-G formula based on SCr of 1.04 mg/dL (H)). Liver Function Tests: Recent Labs  Lab 02/04/18 1432  AST 20  ALT 14  ALKPHOS 65  BILITOT 0.8  PROT 8.3*  ALBUMIN 4.1   No results for input(s): LIPASE, AMYLASE in the last 168 hours. No results for input(s): AMMONIA in the last 168 hours. Coagulation Profile: No results for input(s): INR, PROTIME in the last 168 hours. Cardiac Enzymes: No results for input(s): CKTOTAL, CKMB, CKMBINDEX, TROPONINI in the last 168 hours. BNP (last 3 results) No results for input(s): PROBNP in the last 8760 hours. HbA1C: No results for input(s): HGBA1C in the last 72 hours. CBG: No results for input(s): GLUCAP in the last 168 hours. Lipid Profile: No results for input(s): CHOL, HDL, LDLCALC, TRIG, CHOLHDL, LDLDIRECT in the last 72 hours. Thyroid Function Tests: No results for input(s): TSH, T4TOTAL, FREET4, T3FREE, THYROIDAB in the  last 72 hours. Anemia Panel: No results for input(s): VITAMINB12, FOLATE, FERRITIN, TIBC, IRON, RETICCTPCT in the last 72 hours. Urine analysis:    Component Value Date/Time   COLORURINE YELLOW 02/04/2018 1641   APPEARANCEUR HAZY (A) 02/04/2018 1641   LABSPEC 1.018 02/04/2018 1641   PHURINE 6.0 02/04/2018 1641   GLUCOSEU NEGATIVE 02/04/2018 1641   HGBUR LARGE (A) 02/04/2018 1641   BILIRUBINUR NEGATIVE 02/04/2018 1641   KETONESUR 80 (A) 02/04/2018 1641   PROTEINUR 100 (A) 02/04/2018 1641   UROBILINOGEN 0.2 11/30/2008 2106   NITRITE NEGATIVE 02/04/2018 1641   LEUKOCYTESUR TRACE (A) 02/04/2018 1641   Sepsis Labs: No results found for this or any previous visit (from the past 240 hour(s)).   Radiological Exams on Admission: Ct Abdomen Pelvis W Contrast  Result  Date: 02/04/2018 CLINICAL DATA:  Leukocytosis and flank pain with nausea and vomiting since Saturday. EXAM: CT ABDOMEN AND PELVIS WITH CONTRAST TECHNIQUE: Multidetector CT imaging of the abdomen and pelvis was performed using the standard protocol following bolus administration of intravenous contrast. CONTRAST:  ISOVUE-300 IOPAMIDOL (ISOVUE-300) INJECTION 61% COMPARISON:  12/01/2008 FINDINGS: Lower chest: No acute abnormality. Hepatobiliary: No focal liver abnormality is seen. No gallstones, gallbladder wall thickening, or biliary dilatation. Pancreas: Unremarkable. No pancreatic ductal dilatation or surrounding inflammatory changes. Spleen: Normal in size without focal abnormality. Adrenals/Urinary Tract: Normal bilateral adrenal glands. Abnormal enhancement pattern of the right kidney with striated appearance consistent with acute pyelonephritis. More confluent areas of hypodensity are identified in the upper pole of the right kidney as well as it's medial interpolar aspect that may reflect areas of lobar nephronia. No obstructive uropathy. The urinary bladder is unremarkable without focal mural thickening. Stomach/Bowel: Stomach is within normal limits. Appendix appears normal. No evidence of bowel wall thickening, distention, or inflammatory changes. Vascular/Lymphatic: No significant vascular findings are present. No enlarged abdominal or pelvic lymph nodes. Reproductive: Ut the uterus is unremarkable. Small peripheral follicles are noted within both ovaries. These may reflect stigmata of polycystic ovarian syndrome Other: No free air free fluid. Musculoskeletal: No acute or significant osseous findings. IMPRESSION: 1. Abnormal enhancement pattern of the right kidney with striated appearance of the right kidney consistent with acute pyelonephritis. More confluent areas of hypodensity are identified in the upper pole and medial interpolar aspect of the right kidney that may reflect areas of lobar  nephronia. No obstructive uropathy. 2. Small peripheral follicles are noted within both ovaries. These may reflect stigmata of polycystic ovarian syndrome. Electronically Signed   By: Tollie Eth M.D.   On: 02/04/2018 18:28   CT scan of abdomen and pelvis independently reviewed.  Observed perinephric stranding of the right kidney  Assessment/Plan  Sepsis secondary to pyelonephritis: Patient was initially found to be tachycardic with leukocytosis of 28, but lactic acid was found to be reassuring at 1.14.  Urinalysis showed some concern for infection.  Imaging studies revealed right kidney stranding without signs of obstruction consistent with pyelonephritis.  Patient has been started on empiric antibiotics of Rocephin.  Urology recommended changing to oral antibiotics of Bactrim for 10-day course when able.  Sepsis protocol had not been initiated on admission. - Admit to MedSurg bed - Follow-up urine culture - Continue empiric antibiotics of Rocephin and change to oral antibiotics of Bactrim if medically appropriate - Toradol as needed for pain  Acute kidney injury: Patient presents with a creatinine of 1.04 with BUN 16.  Patient previously noted to have a creatinine of 0.73 back in 2018.  Suspect with  nausea vomiting symptoms - Continue IV fluids at 75 mL/h overnight - Recheck kidney function in a.m.  Transient hypotension: Now resolved after IV fluids. - Continue to monitor  Nausea and vomiting: Acute.  Patient reports persistent nausea and vomiting - Normal saline IV fluids at 75 mL/h - Antiemetics as needed - Advance diet as tolerated  Hypokalemia: Acute.  Initial potassium noted to be 3.4 on admission.  Patient admits to having some muscle cramps due to symptoms. - Give 20 mEq potassium chloride IV due to nausea and vomiting symptoms. - Continue to monitor and replace as needed  DVT prophylaxis: Lovenox Code Status: Full Family Communication: Discussed plan of care with the patient  and family present at bedside Disposition Plan: Likely discharge home in 1 to 2 days Consults called: None Admission status: Observation  Clydie Braun MD [facility] Pager [phone]   If 7PM-7AM, please contact night-coverage [email or web address] Password [email or web address]  02/04/2018, 9:53 PM

## 2018-02-04 NOTE — Discharge Instructions (Addendum)
Take your Bactrim twice a day for 10 days as prescribed. You can take Robaxin as needed for muscle spasms.  Take Zofran as needed for nausea/vomiting. You will need to increase your hydration for the next several days to prevent worsening dehydation. Return to ED for worsening symptoms, increased vomiting, fever, chest pain or shortness of breath.  Your CT showed an incidental finding today listed below: Small peripheral follicles are noted within both ovaries. These may reflect stigmata of polycystic ovarian syndrome. This is something that is nonemergent; you need to follow up with your primary care provider or OB/GYN for this.

## 2018-02-05 DIAGNOSIS — N179 Acute kidney failure, unspecified: Secondary | ICD-10-CM | POA: Diagnosis present

## 2018-02-05 DIAGNOSIS — A419 Sepsis, unspecified organism: Secondary | ICD-10-CM | POA: Diagnosis not present

## 2018-02-05 DIAGNOSIS — N12 Tubulo-interstitial nephritis, not specified as acute or chronic: Secondary | ICD-10-CM | POA: Diagnosis not present

## 2018-02-05 DIAGNOSIS — E538 Deficiency of other specified B group vitamins: Secondary | ICD-10-CM | POA: Diagnosis present

## 2018-02-05 DIAGNOSIS — I959 Hypotension, unspecified: Secondary | ICD-10-CM | POA: Diagnosis present

## 2018-02-05 DIAGNOSIS — E860 Dehydration: Secondary | ICD-10-CM | POA: Diagnosis not present

## 2018-02-05 DIAGNOSIS — E876 Hypokalemia: Secondary | ICD-10-CM | POA: Diagnosis present

## 2018-02-05 DIAGNOSIS — E349 Endocrine disorder, unspecified: Secondary | ICD-10-CM | POA: Diagnosis present

## 2018-02-05 LAB — CBC
HCT: 35.1 % — ABNORMAL LOW (ref 36.0–46.0)
Hemoglobin: 11.5 g/dL — ABNORMAL LOW (ref 12.0–15.0)
MCH: 31.4 pg (ref 26.0–34.0)
MCHC: 32.8 g/dL (ref 30.0–36.0)
MCV: 95.9 fL (ref 80.0–100.0)
Platelets: 169 10*3/uL (ref 150–400)
RBC: 3.66 MIL/uL — AB (ref 3.87–5.11)
RDW: 12.8 % (ref 11.5–15.5)
WBC: 17.1 10*3/uL — ABNORMAL HIGH (ref 4.0–10.5)
nRBC: 0 % (ref 0.0–0.2)

## 2018-02-05 LAB — BASIC METABOLIC PANEL
Anion gap: 5 (ref 5–15)
BUN: 16 mg/dL (ref 6–20)
CO2: 25 mmol/L (ref 22–32)
Calcium: 8.4 mg/dL — ABNORMAL LOW (ref 8.9–10.3)
Chloride: 109 mmol/L (ref 98–111)
Creatinine, Ser: 0.92 mg/dL (ref 0.44–1.00)
GFR calc Af Amer: >60 mL/min (ref >60)
GFR calc non Af Amer: >60 mL/min (ref >60)
Glucose, Bld: 104 mg/dL — ABNORMAL HIGH (ref 70–99)
Potassium: 3.8 mmol/L (ref 3.5–5.1)
Sodium: 139 mmol/L (ref 135–145)

## 2018-02-05 LAB — VITAMIN B12: VITAMIN B 12: 171 pg/mL — AB (ref 180–914)

## 2018-02-05 LAB — HCG, SERUM, QUALITATIVE: Preg, Serum: NEGATIVE

## 2018-02-05 LAB — HCG, QUANTITATIVE, PREGNANCY: hCG, Beta Chain, Quant, S: <1 m[IU]/mL (ref <5)

## 2018-02-05 MED ORDER — POTASSIUM CHLORIDE 10 MEQ/100ML IV SOLN
10.0000 meq | INTRAVENOUS | Status: AC
  Administered 2018-02-05 (×2): 10 meq via INTRAVENOUS
  Filled 2018-02-05: qty 100

## 2018-02-05 MED ORDER — HYDROXYZINE HCL 25 MG PO TABS: 25.0000 mg | ORAL_TABLET | Freq: Three times a day (TID) | ORAL | Status: DC | PRN

## 2018-02-05 MED ORDER — PROCHLORPERAZINE EDISYLATE 10 MG/2ML IJ SOLN
10.0000 mg | Freq: Once | INTRAMUSCULAR | Status: AC
  Administered 2018-02-05: 10 mg via INTRAVENOUS
  Filled 2018-02-05: qty 2

## 2018-02-05 MED ORDER — CYANOCOBALAMIN 1000 MCG/ML IJ SOLN
1000.0000 ug | Freq: Every day | INTRAMUSCULAR | Status: DC
  Administered 2018-02-05 – 2018-02-06 (×2): 1000 ug via INTRAMUSCULAR
  Filled 2018-02-05 (×2): qty 1

## 2018-02-05 MED ORDER — SODIUM CHLORIDE 0.9 % IV BOLUS
1000.0000 mL | Freq: Once | INTRAVENOUS | Status: AC
  Administered 2018-02-05: 1000 mL via INTRAVENOUS

## 2018-02-05 MED ORDER — SODIUM CHLORIDE 0.9 % IV BOLUS: 500.0000 mL | Freq: Once | INTRAVENOUS | Status: AC

## 2018-02-05 NOTE — Progress Notes (Signed)
PROGRESS NOTE    Cassandra Rangel  ZOX:[id number] DOB: 09/26/1995 DOA: 02/04/2018 PCP: Patient, No Pcp Per   Brief Narrative:  Patient is a 22 year old female history of urethral surgery presenting to the ED with complaints of right flank pain, 2 to 3 days as well as nausea and emesis.  Patient seen in the ED urinalysis worrisome for UTI, CT abdomen and pelvis consistent with a right-sided pyelonephritis as well as evidence of lobar nephronia with no significant obstruction.  Patient admitted placed on IV fluids, empiric IV Rocephin.   Assessment & Plan:   Principal Problem:   Sepsis (HCC) Active Problems:   Pyelonephritis   Transient hypotension   AKI (acute kidney injury) (HCC)   Hypokalemia   Elevated serum hCG   Dehydration  #1 acute pyelonephritis right-sided Patient presented with right flank pain.  Urinalysis with large hemoglobin, trace leukocytes, nitrite negative, 21-50 WBCs.  CT abdomen and pelvis done consistent with a right-sided pyelonephritis.  Patient with poor oral intake.  Patient noted to have systolic blood pressures in the 80s to 90s this morning.  We will give a bolus of normal saline 2 L x 1.  Increase IV fluids to normal saline at 125 cc/h.  Check blood cultures x2.  Check urine cultures.  Supportive care.  2.  Dehydration IV fluids.  3.  Hypokalemia Replete.  4.  Transient hypotension Secondary to problem #1.  Check blood cultures x2.  Check urine cultures.  Continue empiric IV Rocephin.  IV fluids.  Supportive care.  5.  Elevated serum hCG Repeat qualitative and quantitative hCG.  6.  Acute kidney injury Secondary to prerenal azotemia.  Hydrate with IV fluids.  Follow.  7.  Leukocytosis Likely secondary to problem #1.  Check urine cultures.  Check blood cultures x2.  Follow.   DVT prophylaxis: Lovenox Code Status: Full Family Communication: Updated patient and mother at bedside. Disposition Plan: Likely home when tolerating oral intake  with no further nausea or emesis.   Consultants:   None  Procedures:   CT abdomen and pelvis 02/04/2018  Antimicrobials:   IV Rocephin 02/04/2018   Subjective: Patient sitting up in bed.  Stated she ate some eggs this morning.  Still with complaints of right-sided flank tenderness and some left-sided flank tenderness.  Denies any chest pain or shortness of breath.  Per RN later on patient dry heaving, nauseous with no further oral intake.  Patient also with some chills per RN.  Per RN patient with some tingling in the hands bilaterally.  Objective: Vitals:   02/05/18 0200 02/05/18 0707 02/05/18 1352 02/05/18 1500  BP: (!) 106/58 100/64 (!) 89/72 120/72  Pulse: 64 62 61   Resp: 16 16 17    Temp: 99.4 F (37.4 C) 98.1 F (36.7 C) 98.4 F (36.9 C)   TempSrc:   Oral   SpO2: 97% 98% 100%   Weight:      Height:        Intake/Output Summary (Last 24 hours) at 02/05/2018 1611 Last data filed at 02/05/2018 1500 Gross per 24 hour  Intake 5417.29 ml  Output 0 ml  Net 5417.29 ml   Filed Weights   02/04/18 1429  Weight: 66.3 kg    Examination:  General exam: NAD Respiratory system: Clear to auscultation. Respiratory effort normal. Cardiovascular system: S1 & S2 heard, RRR. No JVD, murmurs, rubs, gallops or clicks. No pedal edema. Gastrointestinal system: Abdomen is nondistended, soft and nontender. No organomegaly or masses felt. Normal bowel sounds  heard.  Positive right CVA tenderness. Central nervous system: Alert and oriented. No focal neurological deficits. Extremities: Symmetric 5 x 5 power. Skin: No rashes, lesions or ulcers Psychiatry: Judgement and insight appear normal. Mood & affect appropriate.     Data Reviewed: I have personally reviewed following labs and imaging studies  CBC: Recent Labs  Lab 02/04/18 1432 02/05/18 0650  WBC 28.0* 17.1*  NEUTROABS 23.6*  --   HGB 13.1 11.5*  HCT 38.7 35.1*  MCV 95.6 95.9  PLT 206 169   Basic Metabolic  Panel: Recent Labs  Lab 02/04/18 1432 02/05/18 0650  NA 136 139  K 3.4* 3.8  CL 99 109  CO2 24 25  GLUCOSE 130* 104*  BUN 16 16  CREATININE 1.04* 0.92  CALCIUM 9.3 8.4*   GFR: Estimated Creatinine Clearance: 88.1 mL/min (by C-G formula based on SCr of 0.92 mg/dL). Liver Function Tests: Recent Labs  Lab 02/04/18 1432  AST 20  ALT 14  ALKPHOS 65  BILITOT 0.8  PROT 8.3*  ALBUMIN 4.1   No results for input(s): LIPASE, AMYLASE in the last 168 hours. No results for input(s): AMMONIA in the last 168 hours. Coagulation Profile: No results for input(s): INR, PROTIME in the last 168 hours. Cardiac Enzymes: No results for input(s): CKTOTAL, CKMB, CKMBINDEX, TROPONINI in the last 168 hours. BNP (last 3 results) No results for input(s): PROBNP in the last 8760 hours. HbA1C: No results for input(s): HGBA1C in the last 72 hours. CBG: No results for input(s): GLUCAP in the last 168 hours. Lipid Profile: No results for input(s): CHOL, HDL, LDLCALC, TRIG, CHOLHDL, LDLDIRECT in the last 72 hours. Thyroid Function Tests: No results for input(s): TSH, T4TOTAL, FREET4, T3FREE, THYROIDAB in the last 72 hours. Anemia Panel: No results for input(s): VITAMINB12, FOLATE, FERRITIN, TIBC, IRON, RETICCTPCT in the last 72 hours. Sepsis Labs: Recent Labs  Lab 02/04/18 1544  LATICACIDVEN 1.12    Recent Results (from the past 240 hour(s))  Culture, blood (Routine X 2) w Reflex to ID Panel     Status: None (Preliminary result)   Collection Time: 02/05/18 10:15 AM  Result Value Ref Range Status   Specimen Description BLOOD RIGHT ARM  Final   Special Requests   Final    BOTTLES DRAWN AEROBIC ONLY Blood Culture results may not be optimal due to an inadequate volume of blood received in culture bottles Performed [facility], [address] [place], [place] [address]    Culture PENDING  Incomplete   Report Status PENDING  Incomplete         Radiology Studies: Ct  Abdomen Pelvis W Contrast  Result Date: 02/04/2018 CLINICAL DATA:  Leukocytosis and flank pain with nausea and vomiting since Saturday. EXAM: CT ABDOMEN AND PELVIS WITH CONTRAST TECHNIQUE: Multidetector CT imaging of the abdomen and pelvis was performed using the standard protocol following bolus administration of intravenous contrast. CONTRAST:  100mL ISOVUE-300 IOPAMIDOL (ISOVUE-300) INJECTION 61% COMPARISON:  12/01/2008 FINDINGS: Lower chest: No acute abnormality. Hepatobiliary: No focal liver abnormality is seen. No gallstones, gallbladder wall thickening, or biliary dilatation. Pancreas: Unremarkable. No pancreatic ductal dilatation or surrounding inflammatory changes. Spleen: Normal in size without focal abnormality. Adrenals/Urinary Tract: Normal bilateral adrenal glands. Abnormal enhancement pattern of the right kidney with striated appearance consistent with acute pyelonephritis. More confluent areas of hypodensity are identified in the upper pole of the right kidney as well as it's medial interpolar aspect that may reflect areas of lobar nephronia. No obstructive uropathy.  The urinary bladder is unremarkable without focal mural thickening. Stomach/Bowel: Stomach is within normal limits. Appendix appears normal. No evidence of bowel wall thickening, distention, or inflammatory changes. Vascular/Lymphatic: No significant vascular findings are present. No enlarged abdominal or pelvic lymph nodes. Reproductive: Ut the uterus is unremarkable. Small peripheral follicles are noted within both ovaries. These may reflect stigmata of polycystic ovarian syndrome Other: No free air free fluid. Musculoskeletal: No acute or significant osseous findings. IMPRESSION: 1. Abnormal enhancement pattern of the right kidney with striated appearance of the right kidney consistent with acute pyelonephritis. More confluent areas of hypodensity are identified in the upper pole and medial interpolar aspect of the right kidney  that may reflect areas of lobar nephronia. No obstructive uropathy. 2. Small peripheral follicles are noted within both ovaries. These may reflect stigmata of polycystic ovarian syndrome. Electronically Signed   By: Tollie Eth M.D.   On: 02/04/2018 18:28        Scheduled Meds: . enoxaparin (LOVENOX) injection  40 mg Subcutaneous QHS  .  HYDROmorphone (DILAUDID) injection  0.5 mg Intravenous Once   Continuous Infusions: . sodium chloride 125 mL/hr at 02/05/18 0927  . cefTRIAXone (ROCEPHIN)  IV       LOS: 0 days    Time spent: 35 mins    Ramiro Harvest, MD [facility] Pager [phone]  If 7PM-7AM, please contact night-coverage [email or web address] Password [email or web address] 02/05/2018, 4:11 PM

## 2018-02-06 DIAGNOSIS — E876 Hypokalemia: Secondary | ICD-10-CM | POA: Diagnosis not present

## 2018-02-06 DIAGNOSIS — N12 Tubulo-interstitial nephritis, not specified as acute or chronic: Secondary | ICD-10-CM | POA: Diagnosis not present

## 2018-02-06 DIAGNOSIS — N179 Acute kidney failure, unspecified: Secondary | ICD-10-CM | POA: Diagnosis not present

## 2018-02-06 DIAGNOSIS — A419 Sepsis, unspecified organism: Secondary | ICD-10-CM | POA: Diagnosis not present

## 2018-02-06 LAB — BASIC METABOLIC PANEL
ANION GAP: 10 (ref 5–15)
BUN: 7 mg/dL (ref 6–20)
CO2: 21 mmol/L — ABNORMAL LOW (ref 22–32)
Calcium: 8.1 mg/dL — ABNORMAL LOW (ref 8.9–10.3)
Chloride: 107 mmol/L (ref 98–111)
Creatinine, Ser: 0.85 mg/dL (ref 0.44–1.00)
GFR calc Af Amer: >60 mL/min (ref >60)
GFR calc non Af Amer: >60 mL/min (ref >60)
Glucose, Bld: 114 mg/dL — ABNORMAL HIGH (ref 70–99)
Potassium: 3.1 mmol/L — ABNORMAL LOW (ref 3.5–5.1)
Sodium: 138 mmol/L (ref 135–145)

## 2018-02-06 LAB — CBC WITH DIFFERENTIAL/PLATELET
Abs Immature Granulocytes: 0.12 10*3/uL — ABNORMAL HIGH (ref 0.00–0.07)
Basophils Absolute: 0 10*3/uL (ref 0.0–0.1)
Basophils Relative: 0 %
Eosinophils Absolute: 0 10*3/uL (ref 0.0–0.5)
Eosinophils Relative: 0 %
HCT: 32.7 % — ABNORMAL LOW (ref 36.0–46.0)
Hemoglobin: 10.7 g/dL — ABNORMAL LOW (ref 12.0–15.0)
Immature Granulocytes: 1 %
LYMPHS ABS: 2.5 10*3/uL (ref 0.7–4.0)
Lymphocytes Relative: 17 %
MCH: 31.8 pg (ref 26.0–34.0)
MCHC: 32.7 g/dL (ref 30.0–36.0)
MCV: 97 fL (ref 80.0–100.0)
Monocytes Absolute: 2.3 10*3/uL — ABNORMAL HIGH (ref 0.1–1.0)
Monocytes Relative: 16 %
Neutro Abs: 9.7 10*3/uL — ABNORMAL HIGH (ref 1.7–7.7)
Neutrophils Relative %: 66 %
Platelets: 177 10*3/uL (ref 150–400)
RBC: 3.37 MIL/uL — ABNORMAL LOW (ref 3.87–5.11)
RDW: 13 % (ref 11.5–15.5)
WBC: 14.7 10*3/uL — ABNORMAL HIGH (ref 4.0–10.5)
nRBC: 0 % (ref 0.0–0.2)

## 2018-02-06 LAB — HIV ANTIBODY (ROUTINE TESTING W REFLEX): HIV Screen 4th Generation wRfx: NONREACTIVE

## 2018-02-06 MED ORDER — POTASSIUM CHLORIDE CRYS ER 20 MEQ PO TBCR: 40.0000 meq | EXTENDED_RELEASE_TABLET | Freq: Two times a day (BID) | ORAL | Status: DC

## 2018-02-06 MED ORDER — ZOLPIDEM TARTRATE 5 MG PO TABS
5.0000 mg | ORAL_TABLET | Freq: Once | ORAL | Status: AC
  Administered 2018-02-06: 5 mg via ORAL
  Filled 2018-02-06: qty 1

## 2018-02-06 MED ORDER — SULFAMETHOXAZOLE-TRIMETHOPRIM 800-160 MG PO TABS: 1.0000 | ORAL_TABLET | Freq: Two times a day (BID) | ORAL | 0 refills | Status: AC

## 2018-02-06 MED ORDER — SULFAMETHOXAZOLE-TRIMETHOPRIM 800-160 MG PO TABS: 1.0000 | ORAL_TABLET | Freq: Two times a day (BID) | ORAL | 0 refills | Status: DC

## 2018-02-06 MED ORDER — POTASSIUM CHLORIDE CRYS ER 20 MEQ PO TBCR
40.0000 meq | EXTENDED_RELEASE_TABLET | Freq: Two times a day (BID) | ORAL | Status: DC
  Administered 2018-02-06: 40 meq via ORAL
  Filled 2018-02-06: qty 2

## 2018-02-06 NOTE — Progress Notes (Signed)
02/06/18  1817  Reviewed discharge instructions with patient and her mother. Both verbalized understanding of discharge instructions. Copy of discharge instructions and prescription given to patient.

## 2018-02-06 NOTE — Progress Notes (Addendum)
[facility] PROGRESS NOTE    Progress Note  Cassandra Rangel  NWG:[id number] DOB: 1995-03-10 DOA: 02/04/2018 PCP: Patient, No Pcp Per     Brief Narrative:   Cassandra Rangel is an 22 y.o. female past medical history of urethral surgery presenting to the ED with right flank pain, 2 to 3 days of nausea and emesis UA is concerning for a UTI, and CT scan of the abdomen and pelvis showed right-sided pyelonephritis with evidence of a lower nephroma  Assessment/Plan:   Sepsis (HCC) secondary to acute right sided Pyelonephritis CT scan of the abdomen and pelvis on admission done that showed right-sided pyelonephritis. With poor oral intake. She was fluid resuscitated and started empirically on IV antibiotics, sensitivities are pending. She will need a total of 14 days of antibiotic when sensitivities are back. Urine culture showed 60,000 colonies of E. coli    Transient hypotension Resolved with IV fluid hydration. Blood cultures have remained negative.  AKI (acute kidney injury) (HCC) Likely due to sepsis resolved with IV fluid hydration.  Hypokalemia Low again today, replete orally.  Elevated serum hCG POC: Beta-hCG negative.  Vitamin B12 deficiency    DVT prophylaxis: SCD and ambulation Family Communication:mother Disposition Plan/Barrier to D/C: home hopefully today Code Status:     Code Status Orders  (From admission, onward)         Start     Ordered   02/04/18 2210  Full code  Continuous     02/04/18 2213        Code Status History    This patient has a current code status but no historical code status.        IV Access:    Peripheral IV   Procedures and diagnostic studies:   Ct Abdomen Pelvis W Contrast  Result Date: 02/04/2018 CLINICAL DATA:  Leukocytosis and flank pain with nausea and vomiting since Saturday. EXAM: CT ABDOMEN AND PELVIS WITH CONTRAST TECHNIQUE: Multidetector CT imaging of the abdomen and pelvis was performed  using the standard protocol following bolus administration of intravenous contrast. CONTRAST:  ISOVUE-300 IOPAMIDOL (ISOVUE-300) INJECTION 61% COMPARISON:  12/01/2008 FINDINGS: Lower chest: No acute abnormality. Hepatobiliary: No focal liver abnormality is seen. No gallstones, gallbladder wall thickening, or biliary dilatation. Pancreas: Unremarkable. No pancreatic ductal dilatation or surrounding inflammatory changes. Spleen: Normal in size without focal abnormality. Adrenals/Urinary Tract: Normal bilateral adrenal glands. Abnormal enhancement pattern of the right kidney with striated appearance consistent with acute pyelonephritis. More confluent areas of hypodensity are identified in the upper pole of the right kidney as well as it's medial interpolar aspect that may reflect areas of lobar nephronia. No obstructive uropathy. The urinary bladder is unremarkable without focal mural thickening. Stomach/Bowel: Stomach is within normal limits. Appendix appears normal. No evidence of bowel wall thickening, distention, or inflammatory changes. Vascular/Lymphatic: No significant vascular findings are present. No enlarged abdominal or pelvic lymph nodes. Reproductive: Ut the uterus is unremarkable. Small peripheral follicles are noted within both ovaries. These may reflect stigmata of polycystic ovarian syndrome Other: No free air free fluid. Musculoskeletal: No acute or significant osseous findings. IMPRESSION: 1. Abnormal enhancement pattern of the right kidney with striated appearance of the right kidney consistent with acute pyelonephritis. More confluent areas of hypodensity are identified in the upper pole and medial interpolar aspect of the right kidney that may reflect areas of lobar nephronia. No obstructive uropathy. 2. Small peripheral follicles are noted within both ovaries. These may reflect stigmata of polycystic ovarian syndrome. Electronically  Signed   By: Tollie Eth M.D.   On: 02/04/2018 18:28      Medical Consultants:    None.  Anti-Infectives:   Rocephin  Subjective:    [name] labile emotion as she wants to go home today.  Objective:    Vitals:   02/05/18 1352 02/05/18 1500 02/05/18 2055 02/06/18 0525  BP: (!) 89/72 120/72 122/65 113/68  Pulse: 61  78 99  Resp: 17  15 17   Temp: 98.4 F (36.9 C)  98.4 F (36.9 C) 99.9 F (37.7 C)  TempSrc: Oral  Oral Oral  SpO2: 100%  98% 98%  Weight:      Height:        Intake/Output Summary (Last 24 hours) at 02/06/2018 0753 Last data filed at 02/05/2018 1500 Gross per 24 hour  Intake 2009.53 ml  Output -  Net 2009.53 ml   Filed Weights   02/04/18 1429  Weight: 66.3 kg    Exam: General exam: In no acute distress. Respiratory system: Good air movement and clear to auscultation. Cardiovascular system: S1 & S2 heard, RRR.  Gastrointestinal system: Abdomen is nondistended, soft and nontender.  Central nervous system: Alert and oriented. No focal neurological deficits. Extremities: No pedal edema. Skin: No rashes, lesions or ulcers Psychiatry: Judgement and insight appear normal. Mood & affect appropriate.    Data Reviewed:    Labs: Basic Metabolic Panel: Recent Labs  Lab 02/04/18 1432 02/05/18 0650 02/06/18 0650  NA 136 139 138  K 3.4* 3.8 3.1*  CL 99 109 107  CO2 24 25 21*  GLUCOSE 130* 104* 114*  BUN 16 16 7   CREATININE 1.04* 0.92 0.85  CALCIUM 9.3 8.4* 8.1*   GFR Estimated Creatinine Clearance: 95.4 mL/min (by C-G formula based on SCr of 0.85 mg/dL). Liver Function Tests: Recent Labs  Lab 02/04/18 1432  AST 20  ALT 14  ALKPHOS 65  BILITOT 0.8  PROT 8.3*  ALBUMIN 4.1   No results for input(s): LIPASE, AMYLASE in the last 168 hours. No results for input(s): AMMONIA in the last 168 hours. Coagulation profile No results for input(s): INR, PROTIME in the last 168 hours.  CBC: Recent Labs  Lab 02/04/18 1432 02/05/18 0650 02/06/18 0650  WBC 28.0* 17.1* 14.7*   NEUTROABS 23.6*  --  9.7*  HGB 13.1 11.5* 10.7*  HCT 38.7 35.1* 32.7*  MCV 95.6 95.9 97.0  PLT 206 169 177   Cardiac Enzymes: No results for input(s): CKTOTAL, CKMB, CKMBINDEX, TROPONINI in the last 168 hours. BNP (last 3 results) No results for input(s): PROBNP in the last 8760 hours. CBG: No results for input(s): GLUCAP in the last 168 hours. D-Dimer: No results for input(s): DDIMER in the last 72 hours. Hgb A1c: No results for input(s): HGBA1C in the last 72 hours. Lipid Profile: No results for input(s): CHOL, HDL, LDLCALC, TRIG, CHOLHDL, LDLDIRECT in the last 72 hours. Thyroid function studies: No results for input(s): TSH, T4TOTAL, T3FREE, THYROIDAB in the last 72 hours.  Invalid input(s): FREET3 Anemia work up: Recent Labs    02/05/18 1527  VITAMINB12 171*   Sepsis Labs: Recent Labs  Lab 02/04/18 1432 02/04/18 1544 02/05/18 0650 02/06/18 0650  WBC 28.0*  --  17.1* 14.7*  LATICACIDVEN  --  1.12  --   --    Microbiology Recent Results (from the past 240 hour(s))  Urine culture     Status: Abnormal (Preliminary result)   Collection Time: 02/04/18  6:33 PM  Result Value  Ref Range Status   Specimen Description   Final    URINE, RANDOM Performed at [facility], [address] [place], [place] [address]    Special Requests   Final    NONE Performed at [facility], [address] [place], [place] [address]    Culture 60,000 COLONIES/mL ESCHERICHIA COLI (A)  Final   Report Status PENDING  Incomplete  Culture, blood (Routine X 2) w Reflex to ID Panel     Status: None (Preliminary result)   Collection Time: 02/05/18 10:15 AM  Result Value Ref Range Status   Specimen Description BLOOD RIGHT ARM  Final   Special Requests   Final    BOTTLES DRAWN AEROBIC ONLY Blood Culture results may not be optimal due to an inadequate volume of blood received in culture bottles Performed at [facility], [address] [place], [place] [address]    Culture   Final    NO GROWTH < 24 HOURS Performed at [facility], [address] [place], [place] [address]    Report Status PENDING  Incomplete  Culture, blood (Routine X 2) w Reflex to ID Panel     Status: None (Preliminary result)   Collection Time: 02/05/18 10:15 AM  Result Value Ref Range Status   Specimen Description   Final    BLOOD RIGHT HAND Performed at [facility], [address] [place], [place] [address]    Special Requests   Final    BOTTLES DRAWN AEROBIC AND ANAEROBIC Blood Culture adequate volume Performed at [facility], [address] [place], [place] [address]    Culture   Final    NO GROWTH < 24 HOURS Performed at [facility], [address] [place], [place] [address]    Report Status PENDING  Incomplete     Medications:   . cyanocobalamin  1,000 mcg Intramuscular Daily  . enoxaparin (LOVENOX) injection  40 mg Subcutaneous QHS  .  HYDROmorphone (DILAUDID) injection  0.5 mg Intravenous Once   Continuous Infusions: . sodium chloride 125 mL/hr at 02/05/18 2256  . cefTRIAXone (ROCEPHIN)  IV 1 g (02/05/18 1838)     LOS: 0 days   Marinda ElkAbraham Feliz Ortiz  [facility]   *Please refer to [email or web address], password [email or web address] to get updated schedule on who will round on this patient, as hospitalists switch teams weekly. If 7PM-7AM, please contact night-coverage at [email or web address], password [email or web address] for any overnight needs.  02/06/2018, 7:53 AM

## 2018-02-06 NOTE — Discharge Summary (Signed)
Physician Discharge Summary  Cassandra Rangel:[id number] DOB: Jan 19, 1996 DOA: 02/04/2018  PCP: Patient, No Pcp Per  Admit date: 02/04/2018 Discharge date: 02/06/2018  Admitted From: home Disposition:  Home  Recommendations for Outpatient Follow-up:  1. Follow up with PCP in 1-2 weeks   Home Health:No Equipment/Devices:none  Discharge Condition:stable CODE STATUS:Full Diet recommendation: Heart Healthy   Brief/Interim Summary: 22 y.o. female past medical history of urethral surgery presenting to the ED with right flank pain, 2 to 3 days of nausea and emesis UA is concerning for a UTI, and CT scan of the abdomen and pelvis showed right-sided pyelonephritis with evidence of a lower nephroma  Discharge Diagnoses:  Principal Problem:   Sepsis (HCC) Active Problems:   Pyelonephritis   Transient hypotension   AKI (acute kidney injury) (HCC)   Hypokalemia   Elevated serum hCG   Dehydration   Vitamin B12 deficiency Sepsis secondary to acute right-sided pyelonephritis: CT scan of the abdomen pelvis was done on admission that showed right-sided pyelonephritis. She had poor oral intake she was fluid resuscitated. She will start on IV Rocephin, urine culture showed E. coli sensitivities were pending and patient was insisting on going home. She was changed to oral Bactrim which she will continue as an outpatient for total 14 days .  Transient hypotension: This resolved with IV fluid hydration. Her blood cultures have remained negative.  Acute kidney injury: In the setting of sepsis is resolved with IV fluids.  Hypokalemia: It was repleted orally.  Next line   Discharge Instructions  Discharge Instructions    Diet - low sodium heart healthy   Complete by:  As directed    Increase activity slowly   Complete by:  As directed      Allergies as of 02/06/2018   No Known Allergies     Medication List    TAKE these medications   Adapalene 0.3 % gel Apply 1  application topically daily.   aspirin-acetaminophen-caffeine 250-250-65 MG tablet Commonly known as:  EXCEDRIN MIGRAINE Take 2 tablets by mouth daily as needed for headache or migraine.   dicyclomine 20 MG tablet Commonly known as:  BENTYL Take 1 tablet (20 mg total) by mouth 2 (two) times daily.   methocarbamol 500 MG tablet Commonly known as:  ROBAXIN Take 1 tablet (500 mg total) by mouth 2 (two) times daily.   ondansetron 4 MG disintegrating tablet Commonly known as:  ZOFRAN ODT Take 1 tablet (4 mg total) by mouth every 8 (eight) hours as needed for nausea or vomiting.   ondansetron 4 MG tablet Commonly known as:  ZOFRAN Take 1 tablet (4 mg total) by mouth every 8 (eight) hours as needed for nausea or vomiting.   sulfamethoxazole-trimethoprim 800-160 MG tablet Commonly known as:  BACTRIM DS,SEPTRA DS Take 1 tablet by mouth 2 (two) times daily for 12 days.      Follow-up Information    Schedule an appointment as soon as possible for a visit  with [facility].   Contact information: [address] [place] [address] [phone]       [facility]   Specialty:  Emergency Medicine Why:  If symptoms worsen Contact information: [address] [phone] [place] [address] [phone]         No Known Allergies  Consultations:  None   Procedures/Studies: Ct Abdomen Pelvis W Contrast  Result Date: 02/04/2018 CLINICAL DATA:  Leukocytosis and flank pain with nausea and  vomiting since Saturday. EXAM: CT ABDOMEN AND PELVIS WITH CONTRAST TECHNIQUE: Multidetector CT imaging of the abdomen and pelvis was performed using the standard protocol following bolus administration of intravenous contrast. CONTRAST:  ISOVUE-300 IOPAMIDOL (ISOVUE-300) INJECTION 61% COMPARISON:  12/01/2008 FINDINGS: Lower chest: No acute abnormality. Hepatobiliary: No focal liver abnormality is  seen. No gallstones, gallbladder wall thickening, or biliary dilatation. Pancreas: Unremarkable. No pancreatic ductal dilatation or surrounding inflammatory changes. Spleen: Normal in size without focal abnormality. Adrenals/Urinary Tract: Normal bilateral adrenal glands. Abnormal enhancement pattern of the right kidney with striated appearance consistent with acute pyelonephritis. More confluent areas of hypodensity are identified in the upper pole of the right kidney as well as it's medial interpolar aspect that may reflect areas of lobar nephronia. No obstructive uropathy. The urinary bladder is unremarkable without focal mural thickening. Stomach/Bowel: Stomach is within normal limits. Appendix appears normal. No evidence of bowel wall thickening, distention, or inflammatory changes. Vascular/Lymphatic: No significant vascular findings are present. No enlarged abdominal or pelvic lymph nodes. Reproductive: Ut the uterus is unremarkable. Small peripheral follicles are noted within both ovaries. These may reflect stigmata of polycystic ovarian syndrome Other: No free air free fluid. Musculoskeletal: No acute or significant osseous findings. IMPRESSION: 1. Abnormal enhancement pattern of the right kidney with striated appearance of the right kidney consistent with acute pyelonephritis. More confluent areas of hypodensity are identified in the upper pole and medial interpolar aspect of the right kidney that may reflect areas of lobar nephronia. No obstructive uropathy. 2. Small peripheral follicles are noted within both ovaries. These may reflect stigmata of polycystic ovarian syndrome. Electronically Signed   By: Tollie Eth M.D.   On: 02/04/2018 18:28     Subjective: No complains  Discharge Exam: Vitals:   02/06/18 0525 02/06/18 1350  BP: 113/68 105/63  Pulse: 99 75  Resp: 17 16  Temp: 99.9 F (37.7 C) 98.2 F (36.8 C)  SpO2: 98% 100%   Vitals:   02/05/18 1500 02/05/18 2055 02/06/18 0525  02/06/18 1350  BP: 120/72 122/65 113/68 105/63  Pulse:  78 99 75  Resp:  15 17 16   Temp:  98.4 F (36.9 C) 99.9 F (37.7 C) 98.2 F (36.8 C)  TempSrc:  Oral Oral Oral  SpO2:  98% 98% 100%  Weight:      Height:        General: Pt is alert, awake, not in acute distress Cardiovascular: RRR, S1/S2 +, no rubs, no gallops Respiratory: CTA bilaterally, no wheezing, no rhonchi Abdominal: Soft, NT, ND, bowel sounds + Extremities: no edema, no cyanosis    The results of significant diagnostics from this hospitalization (including imaging, microbiology, ancillary and laboratory) are listed below for reference.     Microbiology: Recent Results (from the past 240 hour(s))  Urine culture     Status: Abnormal (Preliminary result)   Collection Time: 02/04/18  6:33 PM  Result Value Ref Range Status   Specimen Description   Final    URINE, RANDOM Performed at [facility], [address], [place], [place] [address]    Special Requests   Final    NONE Performed at [facility], [address] [place], [place] [address]    Culture 60,000 COLONIES/mL ESCHERICHIA COLI (A)  Final   Report Status PENDING  Incomplete  Culture, blood (Routine X 2) w Reflex to ID Panel     Status: None (Preliminary result)   Collection Time: 02/05/18 10:15 AM  Result Value Ref  Range Status   Specimen Description BLOOD RIGHT ARM  Final   Special Requests   Final    BOTTLES DRAWN AEROBIC ONLY Blood Culture results may not be optimal due to an inadequate volume of blood received in culture bottles Performed at [facility], [address] [place], [place] [address]    Culture   Final    NO GROWTH 1 DAY Performed at [facility], [address] [place], [place] [address]    Report Status PENDING  Incomplete  Culture, blood (Routine X 2) w Reflex to ID Panel     Status: None (Preliminary result)   Collection Time: 02/05/18 10:15 AM  Result Value  Ref Range Status   Specimen Description   Final    BLOOD RIGHT HAND Performed at [facility], [address] [place], [place] [address]    Special Requests   Final    BOTTLES DRAWN AEROBIC AND ANAEROBIC Blood Culture adequate volume Performed at [facility], [address] [place], [place] [address]    Culture   Final    NO GROWTH 1 DAY Performed at [facility], [address] [place], [place] [address]    Report Status PENDING  Incomplete     Labs: BNP (last 3 results) No results for input(s): BNP in the last 8760 hours. Basic Metabolic Panel: Recent Labs  Lab 02/04/18 1432 02/05/18 0650 02/06/18 0650  NA 136 139 138  K 3.4* 3.8 3.1*  CL 99 109 107  CO2 24 25 21*  GLUCOSE 130* 104* 114*  BUN 16 16 7   CREATININE 1.04* 0.92 0.85  CALCIUM 9.3 8.4* 8.1*   Liver Function Tests: Recent Labs  Lab 02/04/18 1432  AST 20  ALT 14  ALKPHOS 65  BILITOT 0.8  PROT 8.3*  ALBUMIN 4.1   No results for input(s): LIPASE, AMYLASE in the last 168 hours. No results for input(s): AMMONIA in the last 168 hours. CBC: Recent Labs  Lab 02/04/18 1432 02/05/18 0650 02/06/18 0650  WBC 28.0* 17.1* 14.7*  NEUTROABS 23.6*  --  9.7*  HGB 13.1 11.5* 10.7*  HCT 38.7 35.1* 32.7*  MCV 95.6 95.9 97.0  PLT 206 169 177   Cardiac Enzymes: No results for input(s): CKTOTAL, CKMB, CKMBINDEX, TROPONINI in the last 168 hours. BNP: Invalid input(s): POCBNP CBG: No results for input(s): GLUCAP in the last 168 hours. D-Dimer No results for input(s): DDIMER in the last 72 hours. Hgb A1c No results for input(s): HGBA1C in the last 72 hours. Lipid Profile No results for input(s): CHOL, HDL, LDLCALC, TRIG, CHOLHDL, LDLDIRECT in the last 72 hours. Thyroid function studies No results for input(s): TSH, T4TOTAL, T3FREE, THYROIDAB in the last 72 hours.  Invalid input(s): FREET3 Anemia work up Recent Labs    02/05/18 1527  VITAMINB12 171*    Urinalysis    Component Value Date/Time   COLORURINE YELLOW 02/04/2018 1641   APPEARANCEUR HAZY (A) 02/04/2018 1641   LABSPEC 1.018 02/04/2018 1641   PHURINE 6.0 02/04/2018 1641   GLUCOSEU NEGATIVE 02/04/2018 1641   HGBUR LARGE (A) 02/04/2018 1641   BILIRUBINUR NEGATIVE 02/04/2018 1641   KETONESUR 80 (A) 02/04/2018 1641   PROTEINUR 100 (A) 02/04/2018 1641   UROBILINOGEN 0.2 11/30/2008 2106   NITRITE NEGATIVE 02/04/2018 1641   LEUKOCYTESUR TRACE (A) 02/04/2018 1641   Sepsis Labs Invalid input(s): PROCALCITONIN,  WBC,  LACTICIDVEN Microbiology Recent Results (from the past 240 hour(s))  Urine culture  Status: Abnormal (Preliminary result)   Collection Time: 02/04/18  6:33 PM  Result Value Ref Range Status   Specimen Description   Final    URINE, RANDOM Performed at [facility], [address] [place], [place] [address]    Special Requests   Final    NONE Performed at [facility], [address] [place], [place] [address]    Culture 60,000 COLONIES/mL ESCHERICHIA COLI (A)  Final   Report Status PENDING  Incomplete  Culture, blood (Routine X 2) w Reflex to ID Panel     Status: None (Preliminary result)   Collection Time: 02/05/18 10:15 AM  Result Value Ref Range Status   Specimen Description BLOOD RIGHT ARM  Final   Special Requests   Final    BOTTLES DRAWN AEROBIC ONLY Blood Culture results may not be optimal due to an inadequate volume of blood received in culture bottles Performed at [facility], [address] [place], [place] [address]    Culture   Final    NO GROWTH 1 DAY Performed at [facility], [address] [place], [place] [address]    Report Status PENDING  Incomplete  Culture, blood (Routine X 2) w Reflex to ID Panel     Status: None (Preliminary result)   Collection Time: 02/05/18 10:15 AM  Result Value Ref Range Status   Specimen Description   Final    BLOOD RIGHT  HAND Performed at [facility], [address] [place], [place] [address]    Special Requests   Final    BOTTLES DRAWN AEROBIC AND ANAEROBIC Blood Culture adequate volume Performed at [facility], [address] [place], [place] [address]    Culture   Final    NO GROWTH 1 DAY Performed at [facility], [address] [place], [place] [address]    Report Status PENDING  Incomplete     Time coordinating discharge: 40 minutes  SIGNED:   Marinda ElkAbraham Feliz Ortiz, MD  [facility] 02/06/2018, 4:33 PM Pager   If 7PM-7AM, please contact night-coverage [email or web address] Password [email or web address]

## 2018-02-06 NOTE — Plan of Care (Signed)
  Problem: Activity: Goal: Risk for activity intolerance will decrease Outcome: Progressing   Problem: Nutrition: Goal: Adequate nutrition will be maintained Outcome: Progressing   Problem: Elimination: Goal: Will not experience complications related to bowel motility Outcome: Progressing   

## 2018-02-07 LAB — URINE CULTURE: Culture: 60000 — AB

## 2018-02-10 LAB — CULTURE, BLOOD (ROUTINE X 2)
CULTURE: NO GROWTH
Culture: NO GROWTH
Special Requests: ADEQUATE

## 2018-03-01 DIAGNOSIS — Z23 Encounter for immunization: Secondary | ICD-10-CM | POA: Diagnosis not present

## 2018-03-01 DIAGNOSIS — N12 Tubulo-interstitial nephritis, not specified as acute or chronic: Secondary | ICD-10-CM | POA: Diagnosis not present

## 2018-03-01 DIAGNOSIS — E538 Deficiency of other specified B group vitamins: Secondary | ICD-10-CM | POA: Diagnosis not present

## 2018-03-01 DIAGNOSIS — N179 Acute kidney failure, unspecified: Secondary | ICD-10-CM | POA: Diagnosis not present

## 2018-04-03 DIAGNOSIS — R350 Frequency of micturition: Secondary | ICD-10-CM | POA: Diagnosis not present

## 2018-04-03 DIAGNOSIS — R351 Nocturia: Secondary | ICD-10-CM | POA: Diagnosis not present

## 2018-04-03 DIAGNOSIS — N3000 Acute cystitis without hematuria: Secondary | ICD-10-CM | POA: Diagnosis not present

## 2018-04-03 DIAGNOSIS — R8271 Bacteriuria: Secondary | ICD-10-CM | POA: Diagnosis not present

## 2018-07-02 DIAGNOSIS — R112 Nausea with vomiting, unspecified: Secondary | ICD-10-CM | POA: Diagnosis not present

## 2018-07-04 DIAGNOSIS — F439 Reaction to severe stress, unspecified: Secondary | ICD-10-CM | POA: Diagnosis not present

## 2018-07-04 DIAGNOSIS — R634 Abnormal weight loss: Secondary | ICD-10-CM | POA: Diagnosis not present

## 2018-07-04 DIAGNOSIS — R112 Nausea with vomiting, unspecified: Secondary | ICD-10-CM | POA: Diagnosis not present

## 2018-07-10 DIAGNOSIS — F331 Major depressive disorder, recurrent, moderate: Secondary | ICD-10-CM | POA: Diagnosis not present

## 2018-07-10 DIAGNOSIS — F902 Attention-deficit hyperactivity disorder, combined type: Secondary | ICD-10-CM | POA: Diagnosis not present

## 2018-07-10 DIAGNOSIS — N943 Premenstrual tension syndrome: Secondary | ICD-10-CM | POA: Diagnosis not present

## 2018-07-12 DIAGNOSIS — R112 Nausea with vomiting, unspecified: Secondary | ICD-10-CM | POA: Diagnosis not present

## 2018-07-14 ENCOUNTER — Emergency Department (HOSPITAL_BASED_OUTPATIENT_CLINIC_OR_DEPARTMENT_OTHER)
Admission: EM | Admit: 2018-07-14 | Discharge: 2018-07-14 | Disposition: A | Discharge status: Home / Self Care | Payer: BLUE CROSS/BLUE SHIELD | Attending: Emergency Medicine | Admitting: Emergency Medicine

## 2018-07-14 ENCOUNTER — Emergency Department (HOSPITAL_BASED_OUTPATIENT_CLINIC_OR_DEPARTMENT_OTHER): Payer: BLUE CROSS/BLUE SHIELD

## 2018-07-14 ENCOUNTER — Encounter (HOSPITAL_BASED_OUTPATIENT_CLINIC_OR_DEPARTMENT_OTHER): Payer: Self-pay | Admitting: *Deleted

## 2018-07-14 ENCOUNTER — Other Ambulatory Visit: Payer: Self-pay

## 2018-07-14 DIAGNOSIS — R1013 Epigastric pain: Secondary | ICD-10-CM | POA: Diagnosis not present

## 2018-07-14 DIAGNOSIS — R1110 Vomiting, unspecified: Secondary | ICD-10-CM | POA: Diagnosis not present

## 2018-07-14 DIAGNOSIS — R7989 Other specified abnormal findings of blood chemistry: Secondary | ICD-10-CM

## 2018-07-14 DIAGNOSIS — Z79899 Other long term (current) drug therapy: Secondary | ICD-10-CM | POA: Insufficient documentation

## 2018-07-14 DIAGNOSIS — Z7982 Long term (current) use of aspirin: Secondary | ICD-10-CM | POA: Diagnosis not present

## 2018-07-14 LAB — URINALYSIS, ROUTINE W REFLEX MICROSCOPIC
Bilirubin Urine: NEGATIVE
Glucose, UA: NEGATIVE mg/dL
Ketones, ur: 40 mg/dL — AB
Leukocytes,Ua: NEGATIVE
Nitrite: NEGATIVE
Protein, ur: NEGATIVE mg/dL
Specific Gravity, Urine: 1.025 (ref 1.005–1.030)
pH: 6 (ref 5.0–8.0)

## 2018-07-14 LAB — CBC WITH DIFFERENTIAL/PLATELET
Abs Immature Granulocytes: 0.02 10*3/uL (ref 0.00–0.07)
Basophils Absolute: 0 10*3/uL (ref 0.0–0.1)
Basophils Relative: 0 %
Eosinophils Absolute: 0 10*3/uL (ref 0.0–0.5)
Eosinophils Relative: 0 %
HCT: 39.2 % (ref 36.0–46.0)
Hemoglobin: 13.5 g/dL (ref 12.0–15.0)
Immature Granulocytes: 0 %
Lymphocytes Relative: 24 %
Lymphs Abs: 2 10*3/uL (ref 0.7–4.0)
MCH: 31.3 pg (ref 26.0–34.0)
MCHC: 34.4 g/dL (ref 30.0–36.0)
MCV: 91 fL (ref 80.0–100.0)
Monocytes Absolute: 0.4 10*3/uL (ref 0.1–1.0)
Monocytes Relative: 5 %
Neutro Abs: 6 10*3/uL (ref 1.7–7.7)
Neutrophils Relative %: 71 %
Platelets: 264 10*3/uL (ref 150–400)
RBC: 4.31 MIL/uL (ref 3.87–5.11)
RDW: 12.2 % (ref 11.5–15.5)
WBC: 8.4 10*3/uL (ref 4.0–10.5)
nRBC: 0 % (ref 0.0–0.2)

## 2018-07-14 LAB — COMPREHENSIVE METABOLIC PANEL
ALT: 13 U/L (ref 0–44)
AST: 17 U/L (ref 15–41)
Albumin: 4.8 g/dL (ref 3.5–5.0)
Alkaline Phosphatase: 46 U/L (ref 38–126)
Anion gap: 9 (ref 5–15)
BUN: 16 mg/dL (ref 6–20)
CO2: 22 mmol/L (ref 22–32)
Calcium: 9.4 mg/dL (ref 8.9–10.3)
Chloride: 106 mmol/L (ref 98–111)
Creatinine, Ser: 1.05 mg/dL — ABNORMAL HIGH (ref 0.44–1.00)
GFR calc Af Amer: >60 mL/min (ref >60)
GFR calc non Af Amer: >60 mL/min (ref >60)
Glucose, Bld: 104 mg/dL — ABNORMAL HIGH (ref 70–99)
Potassium: 3.8 mmol/L (ref 3.5–5.1)
Sodium: 137 mmol/L (ref 135–145)
Total Bilirubin: 0.5 mg/dL (ref 0.3–1.2)
Total Protein: 7.4 g/dL (ref 6.5–8.1)

## 2018-07-14 LAB — URINALYSIS, MICROSCOPIC (REFLEX)

## 2018-07-14 LAB — HCG, SERUM, QUALITATIVE: Preg, Serum: NEGATIVE

## 2018-07-14 LAB — LIPASE, BLOOD: Lipase: 39 U/L (ref 11–51)

## 2018-07-14 MED ORDER — IOHEXOL 300 MG/ML  SOLN
100.0000 mL | Freq: Once | INTRAMUSCULAR | Status: AC | PRN
  Administered 2018-07-14: 13:00:00 100 mL via INTRAVENOUS

## 2018-07-14 MED ORDER — SODIUM CHLORIDE 0.9 % IV BOLUS
1000.0000 mL | Freq: Once | INTRAVENOUS | Status: AC
  Administered 2018-07-14: 13:00:00 1000 mL via INTRAVENOUS

## 2018-07-14 NOTE — ED Triage Notes (Signed)
Seen by PCP and was placed on antibiotic for UTI.  Went Urgent Care and was advised to come here to have CT due to unresolved abdominal pain, worst in the morning and at night.  Nauseous in AM and worst at night.

## 2018-07-14 NOTE — ED Provider Notes (Signed)
[facility] EMERGENCY DEPARTMENT Provider Note   CSN: [id number] Arrival date & time: 07/14/18  1137    History   Chief Complaint Chief Complaint  Patient presents with  . Abdominal Pain    HPI Cassandra Rangel is a 23 y.o. female who presents to the ED complaining of gradual onset, constant, achy, epigastric abdominal pain x 1 week. Pt also endorses nausea and nonbloody nonbilious emesis. She was seen by her PCP earlier this week for same; diagnosed with a  UTI and placed on Bactrim. Pt then went to an Urgent Care this AM for continued pain worse in the morning with vomiting mostly in the morning; she had another U/A done today which showed the bacteria cleared but with a mildly elevated leukocytosis of 13.5. She was advised to come to the ED for further evaluation/possible CT A/P. Pt reports she had left flank pain last night that has since resolved; she remarks that she had pyelo in Dec 2019 and her back pain felt similar. LNMP 1 week ago. Denies fever, chest pain, shortness of breath, myalgias, diarrhea, constipation, hematemesis, hematochezia, or any other associated symptoms.        Past Medical History:  Diagnosis Date  . ADHD   . Depression     Patient Active Problem List   Diagnosis Date Noted  . Sepsis (HCC) 02/05/2018  . Transient hypotension 02/05/2018  . AKI (acute kidney injury) (HCC) 02/05/2018  . Hypokalemia 02/05/2018  . Elevated serum hCG 02/05/2018  . Dehydration 02/05/2018  . Vitamin B12 deficiency 02/05/2018  . Pyelonephritis 02/04/2018    Past Surgical History:  Procedure Laterality Date  . URETER SURGERY       OB History   No obstetric history on file.      Home Medications    Prior to Admission medications   Medication Sig Start Date End Date Taking? Authorizing Provider  ondansetron (ZOFRAN ODT) 4 MG disintegrating tablet Take 1 tablet (4 mg total) by mouth every 8 (eight) hours as needed for nausea or vomiting. 02/04/18   Yes Khatri, Hina, PA-C  Adapalene 0.3 % gel Apply 1 application topically daily.    [provider]  aspirin-acetaminophen-caffeine (EXCEDRIN MIGRAINE) [phone] MG tablet Take 2 tablets by mouth daily as needed for headache or migraine.    [provider]  dicyclomine (BENTYL) 20 MG tablet Take 1 tablet (20 mg total) by mouth 2 (two) times daily. Patient not taking: Reported on 02/04/2018 09/16/16   Arthor CaptainHarris, Abigail, PA-C  methocarbamol (ROBAXIN) 500 MG tablet Take 1 tablet (500 mg total) by mouth 2 (two) times daily. 02/04/18   Khatri, Hina, PA-C  ondansetron (ZOFRAN) 4 MG tablet Take 1 tablet (4 mg total) by mouth every 8 (eight) hours as needed for nausea or vomiting. Patient not taking: Reported on 02/04/2018 09/16/16   Arthor CaptainHarris, Abigail, PA-C    Family History History reviewed. No pertinent family history.  Social History Social History   Tobacco Use  . Smoking status: Never Smoker  . Smokeless tobacco: Never Used  Substance Use Topics  . Alcohol use: Yes    Comment: ocassional  . Drug use: Never     Allergies   Patient has no known allergies.   Review of Systems Review of Systems  Constitutional: Positive for chills. Negative for fever.  HENT: Negative for congestion.   Eyes: Negative for redness.  Respiratory: Negative for cough and shortness of breath.   Cardiovascular: Negative for chest pain.  Gastrointestinal: Positive for abdominal  pain, nausea and vomiting. Negative for constipation and diarrhea.  Genitourinary: Positive for flank pain (resolved). Negative for dysuria, frequency, hematuria, pelvic pain, vaginal bleeding and vaginal pain.  Musculoskeletal: Negative for myalgias.  Skin: Negative for rash.  Neurological: Negative for dizziness and light-headedness.     Physical Exam Updated Vital Signs BP (!) 117/95 (BP Location: Right Arm)   Pulse 84   Temp 98 F (36.7 C) (Oral)   Resp 16   Ht 5' 6.5" (1.689 m)   Wt 61.2 kg   LMP  07/13/2018   SpO2 98%   BMI 21.46 kg/m   Physical Exam Vitals signs and nursing note reviewed.  Constitutional:      Appearance: She is not ill-appearing.  HENT:     Head: Normocephalic and atraumatic.  Eyes:     Conjunctiva/sclera: Conjunctivae normal.  Neck:     Musculoskeletal: Neck supple.  Cardiovascular:     Rate and Rhythm: Normal rate and regular rhythm.     Heart sounds: Normal heart sounds.  Pulmonary:     Effort: Pulmonary effort is normal.     Breath sounds: Normal breath sounds. No wheezing, rhonchi or rales.  Abdominal:     General: Abdomen is flat.     Palpations: Abdomen is soft.     Tenderness: There is generalized abdominal tenderness. There is no right CVA tenderness, left CVA tenderness, guarding or rebound.     Comments: Minimal diffuse abdominal tenderness with palpation; no rebound or guarding; no CVA tenderness bilaterally  Skin:    General: Skin is warm and dry.  Neurological:     Mental Status: She is alert.      ED Treatments / Results  Labs (all labs ordered are listed, but only abnormal results are displayed) Labs Reviewed  COMPREHENSIVE METABOLIC PANEL - Abnormal; Notable for the following components:      Result Value   Glucose, Bld 104 (*)    Creatinine, Ser 1.05 (*)    All other components within normal limits  URINALYSIS, ROUTINE W REFLEX MICROSCOPIC - Abnormal; Notable for the following components:   Hgb urine dipstick TRACE (*)    Ketones, ur 40 (*)    All other components within normal limits  URINALYSIS, MICROSCOPIC (REFLEX) - Abnormal; Notable for the following components:   Bacteria, UA MANY (*)    All other components within normal limits  CBC WITH DIFFERENTIAL/PLATELET  LIPASE, BLOOD  HCG, SERUM, QUALITATIVE    EKG None  Radiology Ct Abdomen Pelvis W Contrast  Result Date: 07/14/2018 CLINICAL DATA:  Mid upper abdominal pain, no fever, nausea, vomiting, history of kidney infection EXAM: CT ABDOMEN AND PELVIS WITH  CONTRAST TECHNIQUE: Multidetector CT imaging of the abdomen and pelvis was performed using the standard protocol following bolus administration of intravenous contrast. CONTRAST:  OMNIPAQUE IOHEXOL 300 MG/ML  SOLN COMPARISON:  02/04/2018 FINDINGS: Lower chest: No acute abnormality. Hepatobiliary: No focal liver abnormality is seen. No gallstones, gallbladder wall thickening, or biliary dilatation. Pancreas: Unremarkable. No pancreatic ductal dilatation or surrounding inflammatory changes. Spleen: Normal in size without focal abnormality. Adrenals/Urinary Tract: Adrenal glands are unremarkable. Kidneys are normal, without renal calculi, focal lesion, or hydronephrosis. Bladder is unremarkable. Stomach/Bowel: Stomach is within normal limits. Appendix appears normal. No evidence of bowel wall thickening, distention, or inflammatory changes. Vascular/Lymphatic: No significant vascular findings are present. No enlarged abdominal or pelvic lymph nodes. Reproductive: Bilateral ovarian cysts and/or follicles. Other: No abdominal wall hernia or abnormality. No abdominopelvic ascites. Musculoskeletal: No acute  or significant osseous findings. IMPRESSION: No CT findings of the abdomen or pelvis to explain upper abdominal pain. Electronically Signed   By: Lauralyn Primes M.D.   On: 07/14/2018 13:43    Procedures Procedures (including critical care time)  Medications Ordered in ED Medications  sodium chloride 0.9 % bolus 1,000 mL (0 mLs Intravenous Stopped 07/14/18 1408)  iohexol (OMNIPAQUE) 300 MG/ML solution 100 mL (100 mLs Intravenous Contrast Given 07/14/18 1321)     Initial Impression / Assessment and Plan / ED Course  I have reviewed the triage vital signs and the nursing notes.  Pertinent labs & imaging results that were available during my care of the patient were reviewed by me and considered in my medical decision making (see chart for details).    Pt is a 23 year old otherwise healthy female who  presents to the ED with 1 week of epigastric pain, nausea, and vomiting. Diagnosed with a UTI by PCP; placed on bactrim last week. Had U/A done today at [facility] that was negative for UTI; sent here due to continued pain/suggest CT scan. Pt mildly tender diffusely on exam without peritoneal signs. Given hx of pyelo in December with pt reporting no urinary symptoms at that time/states this feels similar will proceed with CT A/P to rule out. 1 L NS bolus given for comfort as well as baseline labs drawn today so they can be in our system.   CBC without leukocytosis; EKG with mild elevation in creatinine at 1.05; likely from dehydration; already receiving IVF. Lipase negative, pregnancy test negative. CT scan without any acute findings at this time. Still awaiting U/A at this time.   U/A with trace hgb although pt was just on her period; 40 ketones; no LE or nitrites; many bacteria seen but has 6-10 epithelial cells and no WBC per HPF. Upon reeval patient appears comfortable laying in bed; updated her on her results; advised that she increase fluid intake for the next week and follow up with PCP for recheck of creatinine level. Bland diet also discussed as well as finishing course of antibiotics for UTI patient had last week. She is in agreement with plan and stable for discharge home.       Final Clinical Impressions(s) / ED Diagnoses   Final diagnoses:  Epigastric pain  Elevated serum creatinine    ED Discharge Orders    None       Tanda Rockers, PA-C 07/14/18 1603    Gwyneth Sprout, MD 07/15/18 2026

## 2018-07-14 NOTE — ED Notes (Signed)
ED Provider at bedside. 

## 2018-07-14 NOTE — Discharge Instructions (Signed)
You were seen in the ED today for abdominal pain, nausea, and vomiting. Your CT scan of your abdomen was normal. Your labwork was also reassuring besides a mildly elevated kidney function (creatinine) of 1.02. Please increase your fluid intake for the next week and follow up with your PCP for a recheck to ensure it has gone back down to normal.   Your urine did not show any infection today; I recommend finishing course of antibiotics that was prescribed by your PCP.   Return to the ED for any worsening symptoms including worsening pain, vomiting blood, noticing blood in stool, or fever > 100.4.

## 2018-07-30 DIAGNOSIS — R112 Nausea with vomiting, unspecified: Secondary | ICD-10-CM | POA: Diagnosis not present

## 2019-01-27 DIAGNOSIS — Z9189 Other specified personal risk factors, not elsewhere classified: Secondary | ICD-10-CM | POA: Diagnosis not present

## 2019-01-27 DIAGNOSIS — Z20828 Contact with and (suspected) exposure to other viral communicable diseases: Secondary | ICD-10-CM | POA: Diagnosis not present

## 2019-01-29 DIAGNOSIS — Z03818 Encounter for observation for suspected exposure to other biological agents ruled out: Secondary | ICD-10-CM | POA: Diagnosis not present

## 2019-02-19 DIAGNOSIS — Z20828 Contact with and (suspected) exposure to other viral communicable diseases: Secondary | ICD-10-CM | POA: Diagnosis not present

## 2019-02-25 DIAGNOSIS — Z03818 Encounter for observation for suspected exposure to other biological agents ruled out: Secondary | ICD-10-CM | POA: Diagnosis not present

## 2019-04-06 DIAGNOSIS — Z20828 Contact with and (suspected) exposure to other viral communicable diseases: Secondary | ICD-10-CM | POA: Diagnosis not present

## 2019-04-06 DIAGNOSIS — Z20822 Contact with and (suspected) exposure to covid-19: Secondary | ICD-10-CM | POA: Diagnosis not present

## 2019-05-16 DIAGNOSIS — R140 Abdominal distension (gaseous): Secondary | ICD-10-CM | POA: Diagnosis not present

## 2019-05-16 DIAGNOSIS — F419 Anxiety disorder, unspecified: Secondary | ICD-10-CM | POA: Diagnosis not present

## 2019-06-04 DIAGNOSIS — R1013 Epigastric pain: Secondary | ICD-10-CM | POA: Diagnosis not present

## 2019-06-04 DIAGNOSIS — R109 Unspecified abdominal pain: Secondary | ICD-10-CM | POA: Diagnosis not present

## 2019-06-04 DIAGNOSIS — R110 Nausea: Secondary | ICD-10-CM | POA: Diagnosis not present

## 2019-06-04 DIAGNOSIS — R195 Other fecal abnormalities: Secondary | ICD-10-CM | POA: Diagnosis not present

## 2019-06-18 DIAGNOSIS — R195 Other fecal abnormalities: Secondary | ICD-10-CM | POA: Diagnosis not present

## 2019-06-18 DIAGNOSIS — R1013 Epigastric pain: Secondary | ICD-10-CM | POA: Diagnosis not present

## 2019-06-18 DIAGNOSIS — R110 Nausea: Secondary | ICD-10-CM | POA: Diagnosis not present

## 2019-06-18 DIAGNOSIS — R109 Unspecified abdominal pain: Secondary | ICD-10-CM | POA: Diagnosis not present

## 2019-09-01 DIAGNOSIS — F410 Panic disorder [episodic paroxysmal anxiety] without agoraphobia: Secondary | ICD-10-CM | POA: Diagnosis not present

## 2019-09-10 DIAGNOSIS — F410 Panic disorder [episodic paroxysmal anxiety] without agoraphobia: Secondary | ICD-10-CM | POA: Diagnosis not present

## 2019-09-17 DIAGNOSIS — F410 Panic disorder [episodic paroxysmal anxiety] without agoraphobia: Secondary | ICD-10-CM | POA: Diagnosis not present

## 2019-09-25 DIAGNOSIS — F410 Panic disorder [episodic paroxysmal anxiety] without agoraphobia: Secondary | ICD-10-CM | POA: Diagnosis not present

## 2019-12-31 ENCOUNTER — Encounter (HOSPITAL_BASED_OUTPATIENT_CLINIC_OR_DEPARTMENT_OTHER): Payer: Self-pay | Admitting: Emergency Medicine

## 2019-12-31 ENCOUNTER — Other Ambulatory Visit: Payer: Self-pay

## 2019-12-31 ENCOUNTER — Emergency Department (HOSPITAL_BASED_OUTPATIENT_CLINIC_OR_DEPARTMENT_OTHER)
Admission: EM | Admit: 2019-12-31 | Discharge: 2019-12-31 | Disposition: A | Discharge status: Home / Self Care | Payer: BLUE CROSS/BLUE SHIELD | Attending: Emergency Medicine | Admitting: Emergency Medicine

## 2019-12-31 DIAGNOSIS — R519 Headache, unspecified: Secondary | ICD-10-CM | POA: Diagnosis not present

## 2019-12-31 DIAGNOSIS — R55 Syncope and collapse: Secondary | ICD-10-CM | POA: Diagnosis not present

## 2019-12-31 LAB — CBC WITH DIFFERENTIAL/PLATELET
Abs Immature Granulocytes: 0.03 10*3/uL (ref 0.00–0.07)
Basophils Absolute: 0 10*3/uL (ref 0.0–0.1)
Basophils Relative: 0 %
Eosinophils Absolute: 0.1 10*3/uL (ref 0.0–0.5)
Eosinophils Relative: 1 %
HCT: 38.7 % (ref 36.0–46.0)
Hemoglobin: 13.2 g/dL (ref 12.0–15.0)
Immature Granulocytes: 0 %
Lymphocytes Relative: 23 %
Lymphs Abs: 2.4 10*3/uL (ref 0.7–4.0)
MCH: 32.2 pg (ref 26.0–34.0)
MCHC: 34.1 g/dL (ref 30.0–36.0)
MCV: 94.4 fL (ref 80.0–100.0)
Monocytes Absolute: 0.7 10*3/uL (ref 0.1–1.0)
Monocytes Relative: 6 %
Neutro Abs: 7.2 10*3/uL (ref 1.7–7.7)
Neutrophils Relative %: 70 %
Platelets: 280 10*3/uL (ref 150–400)
RBC: 4.1 MIL/uL (ref 3.87–5.11)
RDW: 12.6 % (ref 11.5–15.5)
WBC: 10.4 10*3/uL (ref 4.0–10.5)
nRBC: 0 % (ref 0.0–0.2)

## 2019-12-31 LAB — COMPREHENSIVE METABOLIC PANEL
ALT: 13 U/L (ref 0–44)
AST: 20 U/L (ref 15–41)
Albumin: 4.6 g/dL (ref 3.5–5.0)
Alkaline Phosphatase: 34 U/L — ABNORMAL LOW (ref 38–126)
Anion gap: 9 (ref 5–15)
BUN: 12 mg/dL (ref 6–20)
CO2: 26 mmol/L (ref 22–32)
Calcium: 9.2 mg/dL (ref 8.9–10.3)
Chloride: 105 mmol/L (ref 98–111)
Creatinine, Ser: 0.79 mg/dL (ref 0.44–1.00)
GFR, Estimated: >60 mL/min (ref >60)
Glucose, Bld: 86 mg/dL (ref 70–99)
Potassium: 4.1 mmol/L (ref 3.5–5.1)
Sodium: 140 mmol/L (ref 135–145)
Total Bilirubin: 0.4 mg/dL (ref 0.3–1.2)
Total Protein: 7.3 g/dL (ref 6.5–8.1)

## 2019-12-31 LAB — PREGNANCY, URINE: Preg Test, Ur: NEGATIVE

## 2019-12-31 LAB — URINALYSIS, ROUTINE W REFLEX MICROSCOPIC
Bilirubin Urine: NEGATIVE
Glucose, UA: NEGATIVE mg/dL
Hgb urine dipstick: NEGATIVE
Ketones, ur: NEGATIVE mg/dL
Leukocytes,Ua: NEGATIVE
Nitrite: NEGATIVE
Protein, ur: NEGATIVE mg/dL
Specific Gravity, Urine: 1.015 (ref 1.005–1.030)
pH: 8 (ref 5.0–8.0)

## 2019-12-31 NOTE — ED Notes (Signed)
Pt placed in a gown with a warm blanket. Pt hooked up to the monitor with a 5 lead, BP cuff and pulse ox

## 2019-12-31 NOTE — ED Notes (Signed)
ED Provider at bedside. 

## 2019-12-31 NOTE — ED Provider Notes (Signed)
[facility] EMERGENCY DEPARTMENT Provider Note   CSN: [id number] Arrival date & time: 12/31/19  1933     History Chief Complaint  Patient presents with  . Loss of Consciousness    Cassandra Rangel is a 24 y.o. female.  Patient is a 24 year old female with a history of depression and prior diagnosis in 2019 for AKI, sepsis and elevated serum hCG with pyelonephritis who presents today with an episode of syncope while at the grocery store. Patient reports that she felt fine all day. She had been lounging around her home and sleeping but had eaten and drank fluids. She went to the grocery store to do some shopping and she reports she was standing and when the [place] and noted she did not feel very well and the next thing she remembers is waking up on the floor. A bystander reported that she lowered herself to the floor which the patient does not remember and then was completely unresponsive. She was only unresponsive for a minute or 2 and denies any tongue biting or incontinence. She reports since that time she has had a minimal headache and felt tired but no other symptoms. She denies any recent alcohol use. No recent illness. She denies chest pain, shortness of breath or abdominal pain. Menses was less than a month ago she denies any vaginal discharge. She takes no medications and denies any drug use.  The history is provided by the patient.  Loss of Consciousness      Past Medical History:  Diagnosis Date  . ADHD   . Depression     Patient Active Problem List   Diagnosis Date Noted  . Sepsis (HCC) 02/05/2018  . Transient hypotension 02/05/2018  . AKI (acute kidney injury) (HCC) 02/05/2018  . Hypokalemia 02/05/2018  . Elevated serum hCG 02/05/2018  . Dehydration 02/05/2018  . Vitamin B12 deficiency 02/05/2018  . Pyelonephritis 02/04/2018    Past Surgical History:  Procedure Laterality Date  . URETER SURGERY       OB History   No obstetric history on file.       No family history on file.  Social History   Tobacco Use  . Smoking status: Never Smoker  . Smokeless tobacco: Never Used  Vaping Use  . Vaping Use: Former  Substance Use Topics  . Alcohol use: Yes    Comment: ocassional  . Drug use: Never    Home Medications Prior to Admission medications   Not on File    Allergies    Patient has no known allergies.  Review of Systems   Review of Systems  Cardiovascular: Positive for syncope.  All other systems reviewed and are negative.   Physical Exam Updated Vital Signs BP 106/80   Pulse 66   Temp 97.9 F (36.6 C) (Oral)   Resp 13   Ht 5\' 6"  (1.676 m)   Wt 66.6 kg   LMP 11/30/2019 (Approximate)   SpO2 100%   BMI 23.69 kg/m   Physical Exam Vitals and nursing note reviewed.  Constitutional:      General: She is not in acute distress.    Appearance: Normal appearance. She is well-developed and normal weight.  HENT:     Head: Normocephalic and atraumatic.     Nose: Nose normal.     Mouth/Throat:     Mouth: Mucous membranes are moist.  Eyes:     Pupils: Pupils are equal, round, and reactive to light.  Cardiovascular:     Rate and  Rhythm: Normal rate and regular rhythm.     Heart sounds: Normal heart sounds. No murmur heard.  No friction rub.  Pulmonary:     Effort: Pulmonary effort is normal.     Breath sounds: Normal breath sounds. No wheezing or rales.  Abdominal:     General: Bowel sounds are normal. There is no distension.     Palpations: Abdomen is soft.     Tenderness: There is no abdominal tenderness. There is no guarding or rebound.  Musculoskeletal:        General: No tenderness. Normal range of motion.     Cervical back: Normal range of motion and neck supple.     Right lower leg: No edema.     Left lower leg: No edema.     Comments: No edema  Skin:    General: Skin is warm and dry.     Findings: No rash.  Neurological:     General: No focal deficit present.     Mental Status: She is alert  and oriented to person, place, and time. Mental status is at baseline.     Cranial Nerves: No cranial nerve deficit.  Psychiatric:        Mood and Affect: Mood normal.        Behavior: Behavior normal.        Thought Content: Thought content normal.     ED Results / Procedures / Treatments   Labs (all labs ordered are listed, but only abnormal results are displayed) Labs Reviewed  URINALYSIS, ROUTINE W REFLEX MICROSCOPIC - Abnormal; Notable for the following components:      Result Value   APPearance CLOUDY (*)    All other components within normal limits  COMPREHENSIVE METABOLIC PANEL - Abnormal; Notable for the following components:   Alkaline Phosphatase 34 (*)    All other components within normal limits  PREGNANCY, URINE  CBC WITH DIFFERENTIAL/PLATELET    EKG EKG Interpretation  Date/Time:  Wednesday December 31 2019 19:43:36 EST Ventricular Rate:  65 PR Interval:  134 QRS Duration: 88 QT Interval:  402 QTC Calculation: 418 R Axis:   79 Text Interpretation: Normal sinus rhythm with sinus arrhythmia Normal ECG No previous tracing Confirmed by Gwyneth Sprout ([id number]) on 12/31/2019 8:54:29 PM   Radiology No results found.  Procedures Procedures (including critical care time)  Medications Ordered in ED Medications - No data to display  ED Course  I have reviewed the triage vital signs and the nursing notes.  Pertinent labs & imaging results that were available during my care of the patient were reviewed by me and considered in my medical decision making (see chart for details).    MDM Rules/Calculators/A&P                          Patient presenting with an episode of syncope today while at the grocery store. She did have prodromal symptoms prior to the event. No recent illness patient hCG is negative and UA without evidence of infection. She denies any symptoms earlier today and currently just reports she feels tired. Vital signs are within normal limits and  no prior history of pots.  PERC neg. will check CBC and CMP to ensure normal electrolytes, renal function and hemoglobin. Orthostatics are pending. Neurologically intact and no head injury during the event today. EKG is within normal limits without signs of prolonged QT, Brugada's or WPW.  Labs wnl.  Orthostatics with only mild changes.  Findings discussed with the pt and her mom.  Discussed follow up and return precautions.  MDM Number of Diagnoses or Management Options   Amount and/or Complexity of Data Reviewed Clinical lab tests: ordered and reviewed Tests in the medicine section of CPT: ordered and reviewed Independent visualization of images, tracings, or specimens: yes  Patient Progress Patient progress: stable    Final Clinical Impression(s) / ED Diagnoses Final diagnoses:  Syncope and collapse    Rx / DC Orders ED Discharge Orders    None       Gwyneth Sprout, MD 12/31/19 2159

## 2019-12-31 NOTE — Discharge Instructions (Addendum)
All the blood work and urine today was normal.  Your EKG was also normal and you fainted most likely because your blood pressure dropped.  Make sure you are drinking plenty of fluids and eating plenty of salt in your diet.  If you have any further episodes of passing out you definitely need to follow-up with your doctor.

## 2019-12-31 NOTE — ED Triage Notes (Signed)
Pt reports syncopal episode at 1730 while at grocery store. Pt states she just remembers waking up on the floor. Bystander reports pt was not responsive fopr about 30 sec.

## 2019-12-31 NOTE — ED Notes (Signed)
Pt ambulatory to bathroom by self.  ° °

## 2020-03-24 DIAGNOSIS — R197 Diarrhea, unspecified: Secondary | ICD-10-CM | POA: Diagnosis not present

## 2020-03-24 DIAGNOSIS — R109 Unspecified abdominal pain: Secondary | ICD-10-CM | POA: Diagnosis not present

## 2020-03-24 DIAGNOSIS — R634 Abnormal weight loss: Secondary | ICD-10-CM | POA: Diagnosis not present

## 2020-03-24 DIAGNOSIS — R112 Nausea with vomiting, unspecified: Secondary | ICD-10-CM | POA: Diagnosis not present

## 2020-03-26 ENCOUNTER — Other Ambulatory Visit: Payer: Self-pay | Admitting: Physician Assistant

## 2020-03-26 DIAGNOSIS — R109 Unspecified abdominal pain: Secondary | ICD-10-CM

## 2020-04-13 DIAGNOSIS — Z13220 Encounter for screening for lipoid disorders: Secondary | ICD-10-CM | POA: Diagnosis not present

## 2020-04-13 DIAGNOSIS — Z0000 Encounter for general adult medical examination without abnormal findings: Secondary | ICD-10-CM | POA: Diagnosis not present

## 2020-04-13 DIAGNOSIS — F419 Anxiety disorder, unspecified: Secondary | ICD-10-CM | POA: Diagnosis not present

## 2020-05-17 IMAGING — CT CT ABD-PELV W/ CM
2 of 4 series · 16 of 46 positions shown, 18 images · IV contrast (ISOVUE)
Comparison: 12/01/2008

CLINICAL DATA: Leukocytosis and flank pain with nausea and vomiting
since [REDACTED].

EXAM:
CT ABDOMEN AND PELVIS WITH CONTRAST
TECHNIQUE: Multidetector CT imaging of the abdomen and pelvis was performed
using the standard protocol following bolus administration of
intravenous contrast.
CONTRAST:  100mL [id number] IOPAMIDOL (A5682W-PBB) INJECTION 61%

[Series 2: axial st · axial · 0.71mm/px · z∈[-448,-53]mm · 13 of 89 slices shown, 15 images]
[im 5/89  soft-tissue]
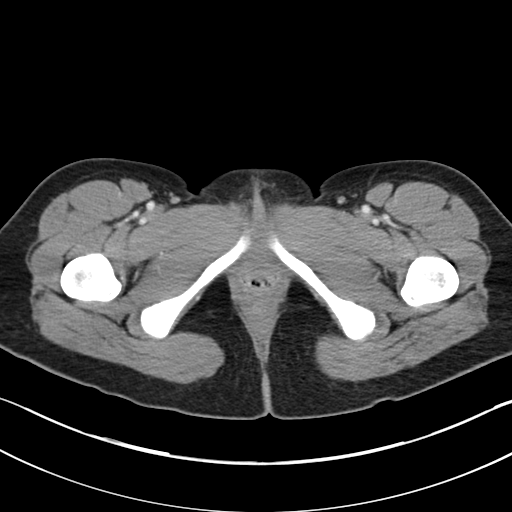
[im 5/89  bone]
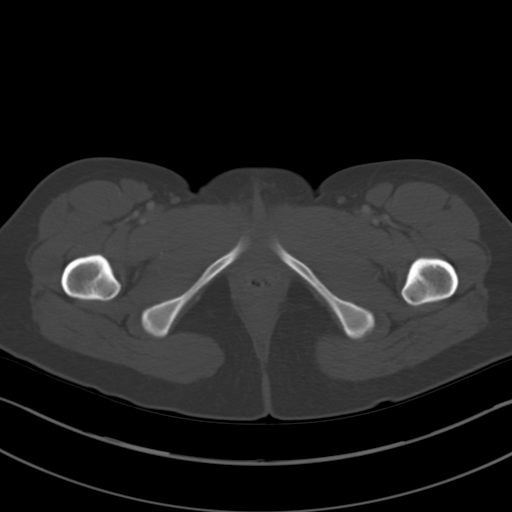
[im 14/89  soft-tissue]
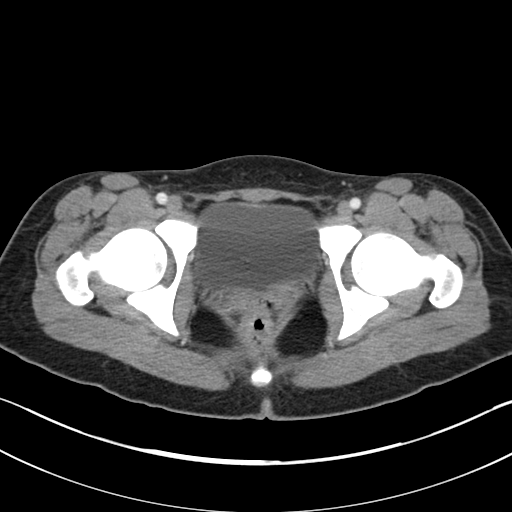
[im 18/89  soft-tissue]
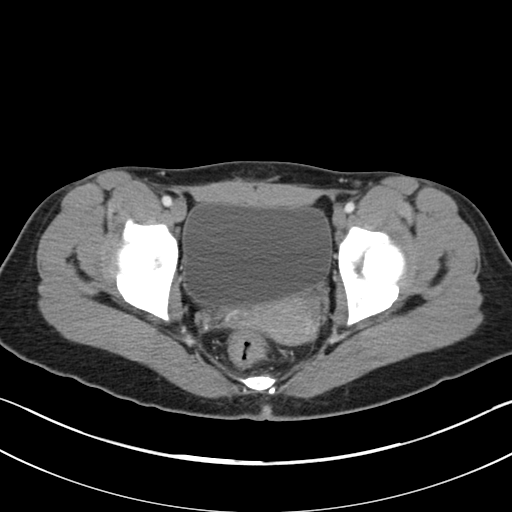
[im 27/89  soft-tissue]
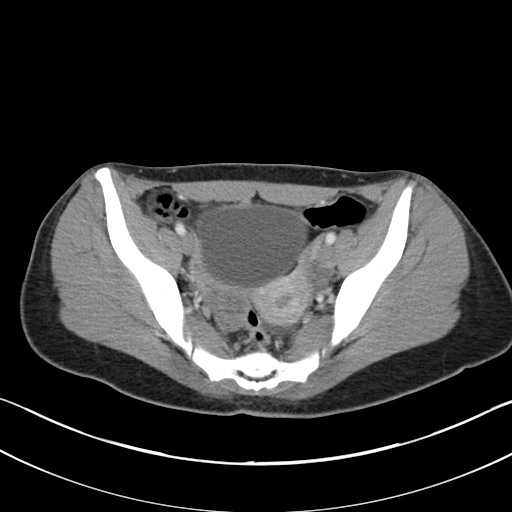
[im 31/89  soft-tissue]
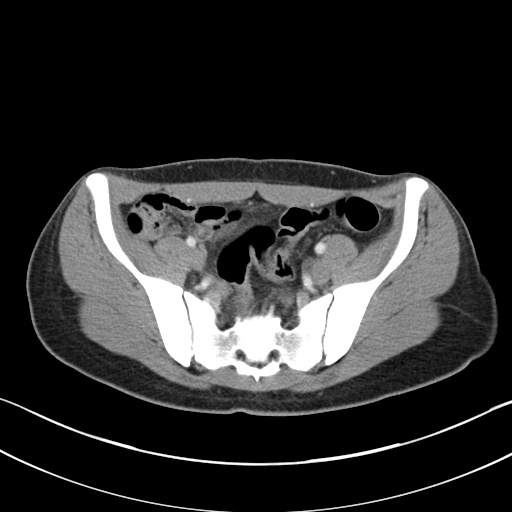
[im 40/89  soft-tissue]
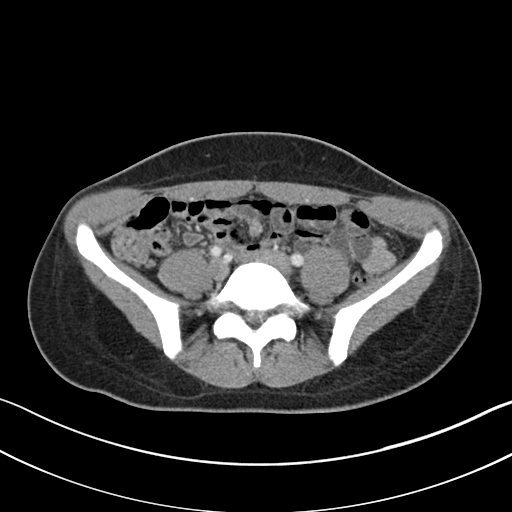
[im 45/89  soft-tissue]
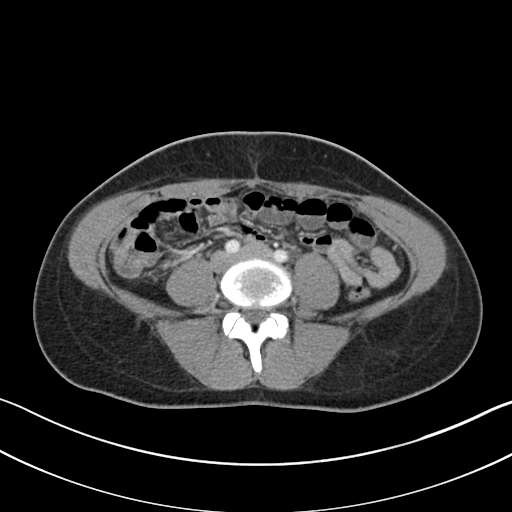
[im 49/89  soft-tissue]
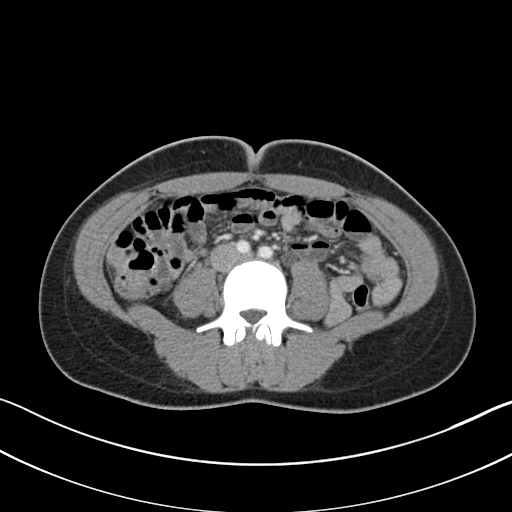
[im 58/89  soft-tissue]
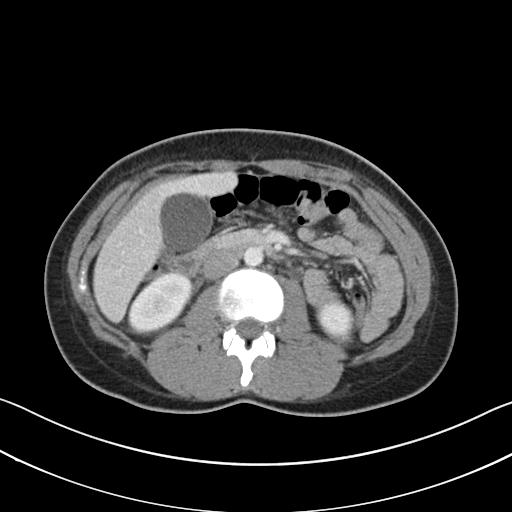
[im 58/89  bone]
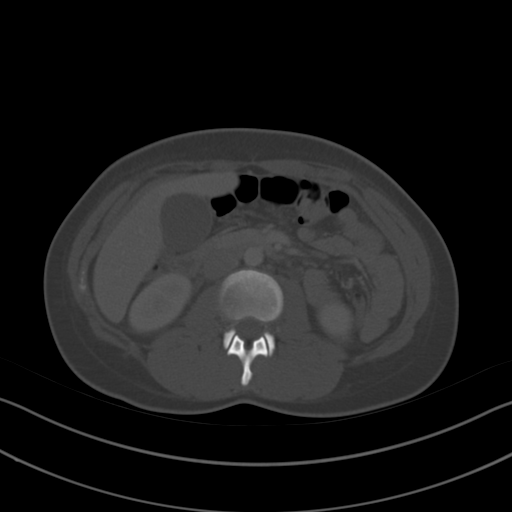
[im 62/89  soft-tissue]
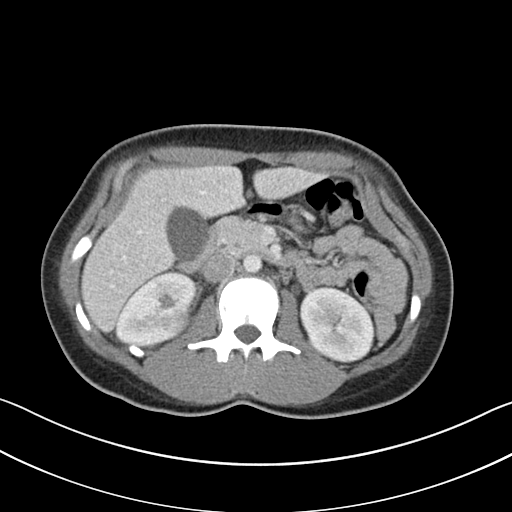
[im 71/89  soft-tissue]
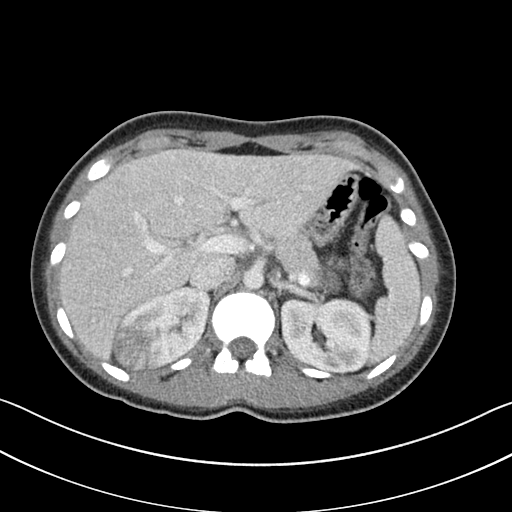
[im 75/89  soft-tissue]
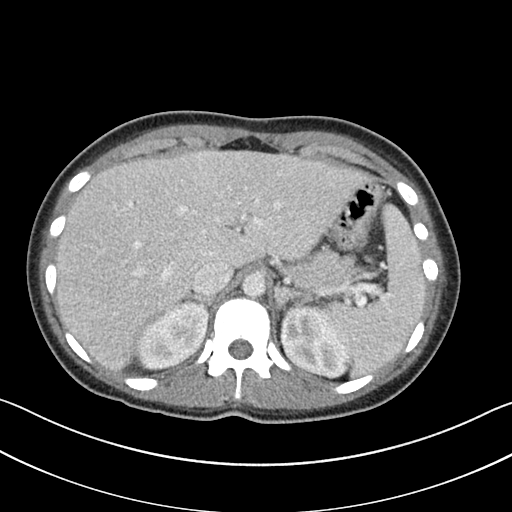
[im 84/89  soft-tissue]
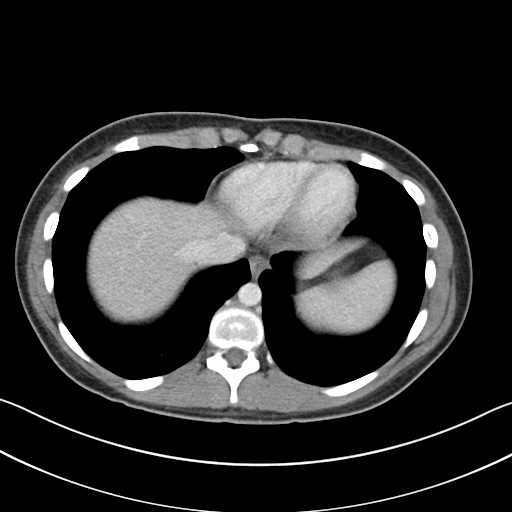

[Series 5: coronal st · coronal · 0.73mm/px · 3 of 72 slices shown]
[im 24/72  soft-tissue]
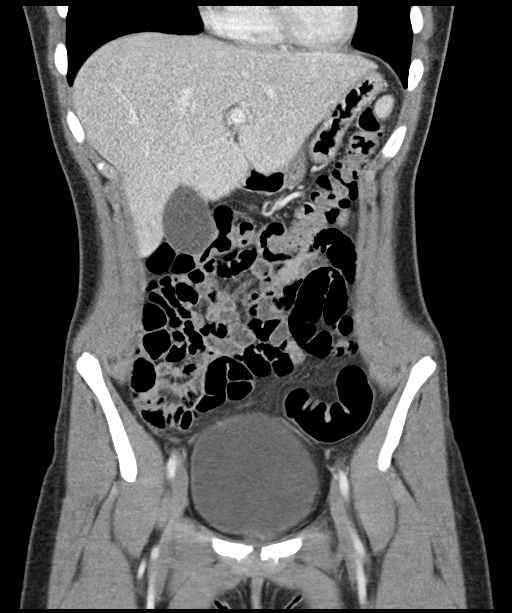
[im 32/72  soft-tissue]
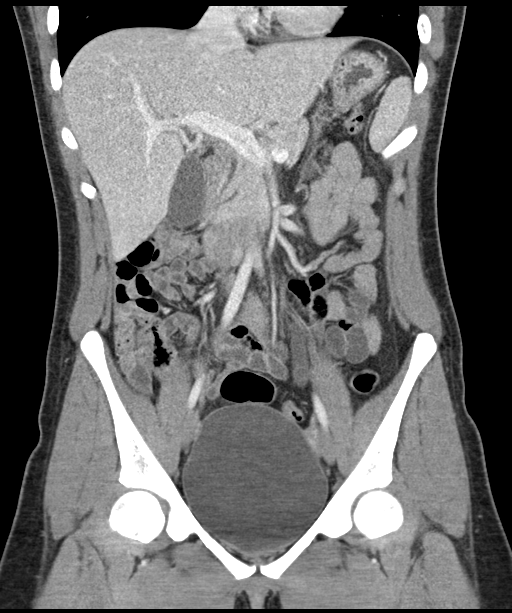
[im 40/72  soft-tissue]
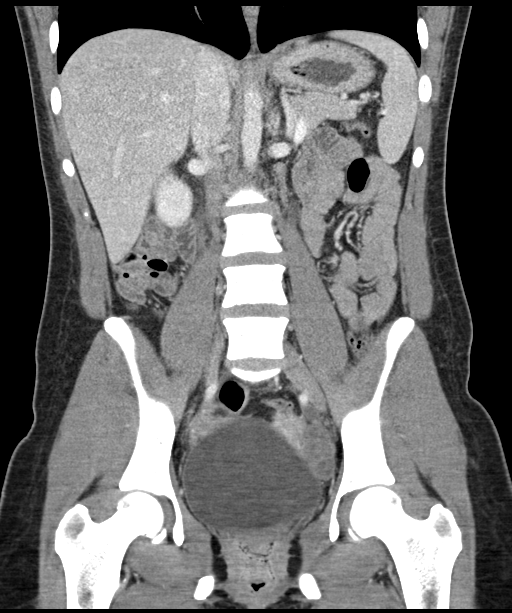

[16 of 46 positions shown; findings below may reference images not displayed]

FINDINGS: Lower chest: No acute abnormality.

Hepatobiliary: No focal liver abnormality is seen. No gallstones,
gallbladder wall thickening, or biliary dilatation.

Pancreas: Unremarkable. No pancreatic ductal dilatation or
surrounding inflammatory changes.

Spleen: Normal in size without focal abnormality.

Adrenals/Urinary Tract: Normal bilateral adrenal glands. Abnormal
enhancement pattern of the right kidney with striated appearance
consistent with acute pyelonephritis. More confluent areas of
hypodensity are identified in the upper pole of the right kidney as
well as it's medial interpolar aspect that may reflect areas of
lobar nephronia. No obstructive uropathy. The urinary bladder is
unremarkable without focal mural thickening.

Stomach/Bowel: Stomach is within normal limits. Appendix appears
normal. No evidence of bowel wall thickening, distention, or
inflammatory changes.

Vascular/Lymphatic: No significant vascular findings are present. No
enlarged abdominal or pelvic lymph nodes.

Reproductive: Ut the uterus is unremarkable. Small peripheral
follicles are noted within both ovaries. These may reflect stigmata
of polycystic ovarian syndrome

Other: No free air free fluid.

Musculoskeletal: No acute or significant osseous findings.
IMPRESSION: 1. Abnormal enhancement pattern of the right kidney with striated
appearance of the right kidney consistent with acute pyelonephritis.
More confluent areas of hypodensity are identified in the upper pole
and medial interpolar aspect of the right kidney that may reflect
areas of lobar nephronia. No obstructive uropathy.
2. Small peripheral follicles are noted within both ovaries. These
may reflect stigmata of polycystic ovarian syndrome.

## 2020-06-08 ENCOUNTER — Other Ambulatory Visit (HOSPITAL_COMMUNITY)
Admission: RE | Admit: 2020-06-08 | Discharge: 2020-06-08 | Disposition: A | Discharge status: Home / Self Care | Payer: BLUE CROSS/BLUE SHIELD | Source: Ambulatory Visit | Attending: Family Medicine | Admitting: Family Medicine

## 2020-06-08 ENCOUNTER — Other Ambulatory Visit: Payer: Self-pay | Admitting: Family Medicine

## 2020-06-08 DIAGNOSIS — Z0000 Encounter for general adult medical examination without abnormal findings: Secondary | ICD-10-CM | POA: Diagnosis not present

## 2020-06-08 DIAGNOSIS — Z23 Encounter for immunization: Secondary | ICD-10-CM | POA: Diagnosis not present

## 2020-06-08 DIAGNOSIS — Z124 Encounter for screening for malignant neoplasm of cervix: Secondary | ICD-10-CM | POA: Insufficient documentation

## 2020-06-08 DIAGNOSIS — Z13220 Encounter for screening for lipoid disorders: Secondary | ICD-10-CM | POA: Diagnosis not present

## 2020-06-09 LAB — CYTOLOGY - PAP: Diagnosis: NEGATIVE

## 2020-09-02 ENCOUNTER — Other Ambulatory Visit: Payer: Self-pay | Admitting: Physician Assistant

## 2020-09-02 DIAGNOSIS — R109 Unspecified abdominal pain: Secondary | ICD-10-CM

## 2020-09-07 DIAGNOSIS — E611 Iron deficiency: Secondary | ICD-10-CM | POA: Diagnosis not present

## 2020-09-07 DIAGNOSIS — R55 Syncope and collapse: Secondary | ICD-10-CM | POA: Diagnosis not present

## 2020-09-07 DIAGNOSIS — R5383 Other fatigue: Secondary | ICD-10-CM | POA: Diagnosis not present

## 2020-11-16 DIAGNOSIS — J069 Acute upper respiratory infection, unspecified: Secondary | ICD-10-CM | POA: Diagnosis not present

## 2020-11-16 DIAGNOSIS — J029 Acute pharyngitis, unspecified: Secondary | ICD-10-CM | POA: Diagnosis not present

## 2020-11-16 DIAGNOSIS — R059 Cough, unspecified: Secondary | ICD-10-CM | POA: Diagnosis not present

## 2020-11-16 DIAGNOSIS — Z03818 Encounter for observation for suspected exposure to other biological agents ruled out: Secondary | ICD-10-CM | POA: Diagnosis not present

## 2020-12-15 DIAGNOSIS — B360 Pityriasis versicolor: Secondary | ICD-10-CM | POA: Diagnosis not present

## 2020-12-15 DIAGNOSIS — L700 Acne vulgaris: Secondary | ICD-10-CM | POA: Diagnosis not present

## 2020-12-15 DIAGNOSIS — L730 Acne keloid: Secondary | ICD-10-CM | POA: Diagnosis not present

## 2020-12-15 DIAGNOSIS — Z23 Encounter for immunization: Secondary | ICD-10-CM | POA: Diagnosis not present

## 2021-01-05 DIAGNOSIS — Z79899 Other long term (current) drug therapy: Secondary | ICD-10-CM | POA: Diagnosis not present

## 2021-01-05 DIAGNOSIS — L730 Acne keloid: Secondary | ICD-10-CM | POA: Diagnosis not present

## 2021-01-05 DIAGNOSIS — B360 Pityriasis versicolor: Secondary | ICD-10-CM | POA: Diagnosis not present

## 2021-01-05 DIAGNOSIS — L700 Acne vulgaris: Secondary | ICD-10-CM | POA: Diagnosis not present

## 2021-01-05 DIAGNOSIS — Z23 Encounter for immunization: Secondary | ICD-10-CM | POA: Diagnosis not present

## 2021-01-26 DIAGNOSIS — R6883 Chills (without fever): Secondary | ICD-10-CM | POA: Diagnosis not present

## 2021-01-26 DIAGNOSIS — Z20822 Contact with and (suspected) exposure to covid-19: Secondary | ICD-10-CM | POA: Diagnosis not present

## 2021-01-26 DIAGNOSIS — J111 Influenza due to unidentified influenza virus with other respiratory manifestations: Secondary | ICD-10-CM | POA: Diagnosis not present

## 2021-03-15 DIAGNOSIS — M25561 Pain in right knee: Secondary | ICD-10-CM | POA: Diagnosis not present

## 2021-03-15 DIAGNOSIS — M79671 Pain in right foot: Secondary | ICD-10-CM | POA: Diagnosis not present

## 2021-03-16 DIAGNOSIS — L700 Acne vulgaris: Secondary | ICD-10-CM | POA: Diagnosis not present

## 2021-03-16 DIAGNOSIS — Z79899 Other long term (current) drug therapy: Secondary | ICD-10-CM | POA: Diagnosis not present

## 2021-03-16 DIAGNOSIS — L730 Acne keloid: Secondary | ICD-10-CM | POA: Diagnosis not present

## 2021-03-23 DIAGNOSIS — S92354A Nondisplaced fracture of fifth metatarsal bone, right foot, initial encounter for closed fracture: Secondary | ICD-10-CM | POA: Diagnosis not present

## 2021-04-04 DIAGNOSIS — M79601 Pain in right arm: Secondary | ICD-10-CM | POA: Diagnosis not present

## 2021-04-19 DIAGNOSIS — L700 Acne vulgaris: Secondary | ICD-10-CM | POA: Diagnosis not present

## 2021-04-19 DIAGNOSIS — Z79899 Other long term (current) drug therapy: Secondary | ICD-10-CM | POA: Diagnosis not present
